# Patient Record
Sex: Female | Born: 1935 | Race: Asian | Hispanic: No | Marital: Single | Smoking: Never smoker
Health system: Southern US, Community
[De-identification: ages and names within clinical notes are randomized; demographics above are authoritative.]

## PROBLEM LIST (undated history)

## (undated) DIAGNOSIS — M549 Dorsalgia, unspecified: Secondary | ICD-10-CM

## (undated) DIAGNOSIS — I10 Essential (primary) hypertension: Secondary | ICD-10-CM

## (undated) DIAGNOSIS — M810 Age-related osteoporosis without current pathological fracture: Secondary | ICD-10-CM

## (undated) DIAGNOSIS — E559 Vitamin D deficiency, unspecified: Secondary | ICD-10-CM

## (undated) DIAGNOSIS — E7800 Pure hypercholesterolemia, unspecified: Secondary | ICD-10-CM

## (undated) HISTORY — PX: GALLBLADDER SURGERY: SHX652

---

## 2012-06-30 ENCOUNTER — Emergency Department (HOSPITAL_COMMUNITY)
Admission: EM | Admit: 2012-06-30 | Discharge: 2012-06-30 | Disposition: A | Discharge status: Home / Self Care | Payer: Medicaid Other | Source: Home / Self Care

## 2012-06-30 ENCOUNTER — Encounter (HOSPITAL_COMMUNITY): Payer: Self-pay | Admitting: Emergency Medicine

## 2012-06-30 DIAGNOSIS — J302 Other seasonal allergic rhinitis: Secondary | ICD-10-CM

## 2012-06-30 DIAGNOSIS — J309 Allergic rhinitis, unspecified: Secondary | ICD-10-CM

## 2012-06-30 MED ORDER — FEXOFENADINE HCL 180 MG PO TABS: 180.0000 mg | ORAL_TABLET | Freq: Every day | ORAL | Status: DC

## 2012-06-30 MED ORDER — FLUTICASONE PROPIONATE 50 MCG/ACT NA SUSP: 1.0000 | Freq: Two times a day (BID) | NASAL | Status: AC

## 2012-06-30 NOTE — ED Notes (Addendum)
Pt c/o sore throat with cough x 2 weeks. Mouth hurts and frequently has to spit. Feels numb all over with lack of appetite.  Also feels warm like she has a fever. Has been taking Robitussin and Acetaminophen with mild relief of symptoms.  Stomach feels warm, but no nausea, vomitting, or diarrhea. Patient is alert and oriented.

## 2012-06-30 NOTE — ED Provider Notes (Signed)
History     CSN: [id number]  Arrival date & time 06/30/12  1331   None     Chief Complaint  Patient presents with  . Sore Throat    (Consider location/radiation/quality/duration/timing/severity/associated sxs/prior treatment) Patient is a 79 y.o. female presenting with pharyngitis. The history is provided by the patient and a relative. The history is limited by a language barrier. Language interpreter used: family member transl.  Sore Throat This is a new problem. The current episode started more than 1 week ago. The problem has not changed since onset.The symptoms are aggravated by swallowing.    History reviewed. No pertinent past medical history.  Past Surgical History  Procedure Laterality Date  . Gallbladder surgery      No family history on file.  History  Substance Use Topics  . Smoking status: Never Smoker   . Smokeless tobacco: Not on file  . Alcohol Use: No    OB History   Grav Para Term Preterm Abortions TAB SAB Ect Mult Living                  Review of Systems  Constitutional: Negative.   HENT: Positive for congestion, sore throat, rhinorrhea, sneezing and postnasal drip.     Allergies  Ampicillin  Home Medications   Current Outpatient Rx  Name  Route  Sig  Dispense  Refill  . fexofenadine (ALLEGRA) 180 MG tablet   Oral   Take 1 tablet (180 mg total) by mouth daily.   30 tablet   1   . fluticasone (FLONASE) 50 MCG/ACT nasal spray   Nasal   Place 1 spray into the nose 2 (two) times daily.   1 g   2     BP 164/78  Pulse 64  Temp(Src) 98.1 F (36.7 C) (Oral)  Resp 16  SpO2 98%  Physical Exam  Nursing note and vitals reviewed. Constitutional: She is oriented to person, place, and time. She appears well-developed and well-nourished.  HENT:  Head: Normocephalic.  Right Ear: External ear normal.  Left Ear: External ear normal.  Mouth/Throat: Oropharynx is clear and moist.  Eyes: Pupils are equal, round, and reactive to light.   Neck: Normal range of motion. Neck supple.  Abdominal: Soft. Bowel sounds are normal. She exhibits no mass. There is no tenderness.  Lymphadenopathy:    She has no cervical adenopathy.  Neurological: She is alert and oriented to person, place, and time.  Skin: Skin is warm and dry.    ED Course  Procedures (including critical care time)  Labs Reviewed  POCT RAPID STREP A ([facility] CARE ONLY)   No results found.   1. Seasonal allergic rhinitis       MDM          Linna Hoff, MD 06/30/12 1520

## 2012-12-29 DIAGNOSIS — Z1211 Encounter for screening for malignant neoplasm of colon: Secondary | ICD-10-CM | POA: Diagnosis not present

## 2012-12-29 DIAGNOSIS — N951 Menopausal and female climacteric states: Secondary | ICD-10-CM | POA: Diagnosis not present

## 2012-12-29 DIAGNOSIS — N949 Unspecified condition associated with female genital organs and menstrual cycle: Secondary | ICD-10-CM | POA: Diagnosis not present

## 2012-12-29 DIAGNOSIS — Z23 Encounter for immunization: Secondary | ICD-10-CM | POA: Diagnosis not present

## 2012-12-29 DIAGNOSIS — E785 Hyperlipidemia, unspecified: Secondary | ICD-10-CM | POA: Diagnosis not present

## 2012-12-29 DIAGNOSIS — Z0000 Encounter for general adult medical examination without abnormal findings: Secondary | ICD-10-CM | POA: Diagnosis not present

## 2013-01-04 ENCOUNTER — Other Ambulatory Visit: Payer: Self-pay | Admitting: Family Medicine

## 2013-01-04 DIAGNOSIS — R102 Pelvic and perineal pain: Secondary | ICD-10-CM

## 2013-01-11 ENCOUNTER — Ambulatory Visit
Admission: RE | Admit: 2013-01-11 | Discharge: 2013-01-11 | Disposition: A | Discharge status: Home / Self Care | Payer: Medicare Other | Source: Ambulatory Visit | Attending: Family Medicine | Admitting: Family Medicine

## 2013-01-11 DIAGNOSIS — N949 Unspecified condition associated with female genital organs and menstrual cycle: Secondary | ICD-10-CM | POA: Diagnosis not present

## 2013-01-11 DIAGNOSIS — R102 Pelvic and perineal pain: Secondary | ICD-10-CM

## 2013-01-19 DIAGNOSIS — M810 Age-related osteoporosis without current pathological fracture: Secondary | ICD-10-CM | POA: Diagnosis not present

## 2013-01-19 DIAGNOSIS — Z780 Asymptomatic menopausal state: Secondary | ICD-10-CM | POA: Diagnosis not present

## 2013-06-01 ENCOUNTER — Encounter (HOSPITAL_COMMUNITY): Payer: Self-pay | Admitting: Radiology

## 2013-06-01 ENCOUNTER — Emergency Department (HOSPITAL_COMMUNITY): Payer: Medicare Other

## 2013-06-01 ENCOUNTER — Emergency Department (HOSPITAL_COMMUNITY)
Admission: EM | Admit: 2013-06-01 | Discharge: 2013-06-01 | Disposition: A | Discharge status: Home / Self Care | Payer: Medicare Other | Attending: Emergency Medicine | Admitting: Emergency Medicine

## 2013-06-01 DIAGNOSIS — R51 Headache: Secondary | ICD-10-CM | POA: Diagnosis not present

## 2013-06-01 DIAGNOSIS — Z881 Allergy status to other antibiotic agents status: Secondary | ICD-10-CM | POA: Diagnosis not present

## 2013-06-01 DIAGNOSIS — R519 Headache, unspecified: Secondary | ICD-10-CM

## 2013-06-01 DIAGNOSIS — R109 Unspecified abdominal pain: Secondary | ICD-10-CM | POA: Insufficient documentation

## 2013-06-01 LAB — COMPREHENSIVE METABOLIC PANEL
ALT: 21 U/L (ref 0–35)
AST: 41 U/L — ABNORMAL HIGH (ref 0–37)
Albumin: 4 g/dL (ref 3.5–5.2)
Alkaline Phosphatase: 89 U/L (ref 39–117)
BUN: 11 mg/dL (ref 6–23)
CO2: 25 mEq/L (ref 19–32)
Calcium: 9.5 mg/dL (ref 8.4–10.5)
Chloride: 106 mEq/L (ref 96–112)
Creatinine, Ser: 0.64 mg/dL (ref 0.50–1.10)
GFR calc Af Amer: >90 mL/min (ref >90)
GFR calc non Af Amer: 83 mL/min — ABNORMAL LOW (ref >90)
Glucose, Bld: 87 mg/dL (ref 70–99)
Potassium: 4 mEq/L (ref 3.7–5.3)
Sodium: 143 mEq/L (ref 137–147)
Total Bilirubin: 0.6 mg/dL (ref 0.3–1.2)
Total Protein: 7.8 g/dL (ref 6.0–8.3)

## 2013-06-01 LAB — CBC WITH DIFFERENTIAL/PLATELET
Basophils Absolute: 0 10*3/uL (ref 0.0–0.1)
Basophils Relative: 1 % (ref 0–1)
Eosinophils Absolute: 0.1 10*3/uL (ref 0.0–0.7)
Eosinophils Relative: 2 % (ref 0–5)
HCT: 38.3 % (ref 36.0–46.0)
Hemoglobin: 12.7 g/dL (ref 12.0–15.0)
Lymphocytes Relative: 30 % (ref 12–46)
Lymphs Abs: 1.8 10*3/uL (ref 0.7–4.0)
MCH: 29.6 pg (ref 26.0–34.0)
MCHC: 33.2 g/dL (ref 30.0–36.0)
MCV: 89.3 fL (ref 78.0–100.0)
Monocytes Absolute: 0.4 10*3/uL (ref 0.1–1.0)
Monocytes Relative: 7 % (ref 3–12)
Neutro Abs: 3.7 10*3/uL (ref 1.7–7.7)
Neutrophils Relative %: 61 % (ref 43–77)
Platelets: 128 10*3/uL — ABNORMAL LOW (ref 150–400)
RBC: 4.29 MIL/uL (ref 3.87–5.11)
RDW: 13.7 % (ref 11.5–15.5)
WBC: 6 10*3/uL (ref 4.0–10.5)

## 2013-06-01 LAB — URINALYSIS, ROUTINE W REFLEX MICROSCOPIC
Bilirubin Urine: NEGATIVE
Glucose, UA: NEGATIVE mg/dL
Hgb urine dipstick: NEGATIVE
Ketones, ur: NEGATIVE mg/dL
Leukocytes, UA: NEGATIVE
Nitrite: NEGATIVE
Protein, ur: NEGATIVE mg/dL
Specific Gravity, Urine: 1.01 (ref 1.005–1.030)
Urobilinogen, UA: 0.2 mg/dL (ref 0.0–1.0)
pH: 6.5 (ref 5.0–8.0)

## 2013-06-01 LAB — LIPASE, BLOOD: Lipase: 31 U/L (ref 11–59)

## 2013-06-01 NOTE — ED Notes (Addendum)
Pt reports dizziness, headache, LLQ pain in abdomen. Pain is 10/10. Pt reports bright red blood with BMs for 4 times. Belly is nontender. Denies black, tarry stools. Reports hx of stones in gallbladder when living in [place]; did US and stones were removed. Denies urinary symtpoms today. Vomited x 1. Denies bloody emesis. Denies LOC when dizzy. Pt also reports blurry vision with her symptoms. Reports her symptoms are similar to when she had gallbladder stones.

## 2013-06-01 NOTE — ED Notes (Signed)
Pt from home via [facility] with c/o headache x 1 month with neck pain and body aches.  Pt reports taking fluticasone for her headache and [place] Re[place] Mush[place] supplements.  Pt in NAD.

## 2013-06-01 NOTE — ED Notes (Signed)
interpreter phone being used to talk with pt.

## 2013-06-01 NOTE — ED Provider Notes (Signed)
CSN: [id number]     Arrival date & time 06/01/13  1328 History   First MD Initiated Contact with Patient 06/01/13 1403     Chief Complaint  Patient presents with  . Headache   Tanslator used, hx by patient and family HPI Pt has been having trouble with headache and dizziness for the last month.  It is a daily occurrence.  The headache is in the entire head.  She has not taken any medication for the headache.  She feels lightheaded.  The symptoms are mild.  She does not feel like she has a "heavy dizziness" or a strong sensation of movement.  No trouble with speech, vision, weakness, or falling to one side or the other.  She feels weak all over with the headache.  Pt is worried about stroke.  Pt also has had some mild pain in her lower abdomen on the left intermittently.  When she pushes on her abdomen it hurts.   Blood in stool previously but not recently.  Her primary concern is her head.  No fever or vomiting.  No weight loss.  Pt has had abdominal pain before.  Her PCP recommended colonoscopy last year but the patient was not interested. History reviewed. No pertinent past medical history. Past Surgical History  Procedure Laterality Date  . Gallbladder surgery     No family history on file. History  Substance Use Topics  . Smoking status: Never Smoker   . Smokeless tobacco: Not on file  . Alcohol Use: No   OB History   Grav Para Term Preterm Abortions TAB SAB Ect Mult Living                 Review of Systems  All other systems reviewed and are negative.     Allergies  Ampicillin  Home Medications   Prior to Admission medications   Medication Sig Start Date End Date Taking? Authorizing Provider  fluticasone (FLONASE) 50 MCG/ACT nasal spray Place 1 spray into the nose 2 (two) times daily. 06/30/12  Yes Linna HoffJames D Kindl, MD  OVER THE COUNTER MEDICATION Take 1 tablet by mouth daily. Japanese supplement   Yes Historical Provider, MD   BP 168/84  Pulse 61  Temp(Src) 98.5 F  (36.9 C) (Oral)  Resp 16  SpO2 100% Physical Exam  Nursing note and vitals reviewed. Constitutional: She is oriented to person, place, and time. She appears well-developed and well-nourished. No distress.  HENT:  Head: Normocephalic and atraumatic.  Right Ear: External ear normal.  Left Ear: External ear normal.  Mouth/Throat: Oropharynx is clear and moist.  Eyes: Conjunctivae are normal. Right eye exhibits no discharge. Left eye exhibits no discharge. No scleral icterus.  Neck: Neck supple. No tracheal deviation present.  Cardiovascular: Normal rate, regular rhythm and intact distal pulses.   Pulmonary/Chest: Effort normal and breath sounds normal. No stridor. No respiratory distress. She has no wheezes. She has no rales.  Abdominal: Soft. Bowel sounds are normal. She exhibits no distension. There is no tenderness. There is no rebound and no guarding.  Musculoskeletal: She exhibits no edema and no tenderness.  Neurological: She is alert and oriented to person, place, and time. She has normal strength. No cranial nerve deficit (no facial droop, extraocular movements intact, no slurred speech) or sensory deficit. She exhibits normal muscle tone. She displays no seizure activity. Coordination normal.  No pronator drift bilateral upper extrem, able to hold both legs off bed for 5 seconds, sensation intact in all extremities,  no visual field cuts, no left or right sided neglect, normal finger-nose exam bilaterally, no nystagmus noted   Skin: Skin is warm and dry. No rash noted.  Psychiatric: She has a normal mood and affect.    ED Course  Procedures (including critical care time) Labs Review Labs Reviewed  CBC WITH DIFFERENTIAL - Abnormal; Notable for the following:    Platelets 128 (*)    All other components within normal limits  COMPREHENSIVE METABOLIC PANEL - Abnormal; Notable for the following:    AST 41 (*)    GFR calc non Af Amer 83 (*)    All other components within normal  limits  URINALYSIS, ROUTINE W REFLEX MICROSCOPIC  LIPASE, BLOOD    Imaging Review Ct Head Wo Contrast  06/01/2013   CLINICAL DATA:  Headache  EXAM: CT HEAD WITHOUT CONTRAST  TECHNIQUE: Contiguous axial images were obtained from the base of the skull through the vertex without intravenous contrast.  COMPARISON:  None.  FINDINGS: Mild atrophy, appropriate for age. Mild chronic microvascular ischemic change in the left frontal white matter.  Negative for acute infarct. Negative for hemorrhage or mass. No acute skull abnormality.  IMPRESSION: No acute abnormality. Small area of chronic ischemia left frontal white matter.   Electronically Signed   By: Marlan Palauharles  Clark M.D.   On: 06/01/2013 16:43   Dg Abd Acute W/chest  06/01/2013   CLINICAL DATA:  Abdominal pain  EXAM: ACUTE ABDOMEN SERIES (ABDOMEN 2 VIEW & CHEST 1 VIEW)  COMPARISON:  None.  FINDINGS: The heart and pulmonary vascularity are within normal limits. The lungs are well aerated. Exuberant calcification is noted at the first costochondral margin on the left. No focal infiltrate is seen.  The abdomen shows a nonobstructive bowel gas pattern. Fecal material is seen throughout the colon. Degenerative changes of the lumbar spine are seen. No acute bony abnormality is noted.  IMPRESSION: Nonspecific chest and abdomen.   Electronically Signed   By: Alcide CleverMark  Lukens M.D.   On: 06/01/2013 16:17     MDM   Final diagnoses:  Headache    Normal neuro exam.  Symptoms have been ongoing for a few months.  Pt is in no distress in the ED, smiling, able to stand without difficulty.  ?tension headache.  Doubt acute emergency medical condition, ie hemorrhage, stroke.  Abdominal pain is also an intermittent chronic complaint.  No abdominal ttp on exam.  Nl CBC.    Since she has seen blood intermittently colonoscopy would be a reasonable follow up tests.  Discussed follow up with PCP whom suggested this in the past.  Discussed findings and recommendation with  patient and family who are reassured and comfortable with outpatient follow up.    Celene KrasJon R Dorr Perrot, MD 06/01/13 [phone]

## 2013-06-01 NOTE — ED Notes (Signed)
Pt back from CT

## 2013-06-01 NOTE — Discharge Instructions (Signed)
?au ??u Do C?ng Th?ng (Tension Headache) ?au ??u do c?ng th?ng l m?t c?m gic ?au, p l?c ho?c ?au th??ng c?m th?y pha tr??c v hai bn ??u. C?n ?au c th? khng r rng ho?c c th? c?m th?y ch?t (th?t). ?y l ki?u ?au ??u ph? bi?n nh?t. ?au ??u c?ng th?ng th??ng khng km theo bu?n nn ho?c nn m?a v khng tr? nn t?i t? h?n v?i ho?t ??ng th? ch?t. ?au ??u do c?ng th?ng c th? ko di 30 pht ??n vi ngy.  NGUYN NHN Nguyn nhn chnh xc khng ???c xc ??nh, nh?ng c th? gy ra b?i ha ch?t v hocmon trong no d?n ??n ?au. ?au ??u do c?ng th?ng th??ng b?t ??u sau khi c?ng th?ng, lo u ho?c tr?m c?m. Cc tc nhn khc c th? bao g?m:  R??u.  Caffeine (qu nhi?u ho?c thu h?i).  Nhi?m trng h h?p (c?m l?nh, cm, vim xoang).  V?n ?? nha khoa ho?c si?t ch?t r?ng.  M?t m?i.  Gi? ??u v c? c?a b?n ? m?t v? tr qu lu trong khi s? d?ng my tnh. TRI?U CH?NG  C?ng th?ng xung quanh ??u.  ?au ??u khng r rng.  C?m th?y ?au pha tr??c v hai bn ??u.  Nh?y c?m ?au ? cc c? c?a ??u, c? v vai. CH?N ?ON ?au ??u do c?ng th?ng th??ng ???c ch?n ?on d?a vo:  Cc tri?u ch?ng.  Khm th?c th?.  Ch?p CT ho?c MRI ??u c?a b?n. C th? yu c?u cc xt nghi?m n?u c cc tri?u ch?ng n?ng ho?c b?t th??ng. ?I?U TR? Thu?c c th? ???c ch? ??nh ?? gip gi?m cc tri?u ch?ng. H??NG D?N CH?M [place] T?I NH  Ch? s? d?ng thu?ckhng c?n k toa ho?c thu?c c?n k toa ?? gi?m ?au ho?c gi?m c?m gic kh ch?u theo ch? d?n c?a chuyn gia ch?m [place] s?c kh?e c?a b?n.  N?m xu?ng trong m?t c?n phng t?i, yn t?nh, khi b?n b? ?au ??u.  Ghi chp l?i ?? tm hi?u nh?ng g c th? gy ra ch?ng ?au ??u c?a b?n. V d?, ghi l?i:  Nh?ng g b?n ?n v u?ng.  B?n ng? ???c bao lu.  B?t k? thay ??i no c?a ch? ?? ?n u?ng ho?c cc lo?i thu?c c?a b?n.  Hy th? xoa bp ho?c cc k? thu?t th? gin khc.  C th? ch??m n??c ? ho?c nng ln ??u v c?. S? d?ng cc cch ny t? 3 ??n 4 l?n m?i ngy trong 15 ??n 20 pht m?i  l?n, ho?c khi c?n thi?t.  H?n ch? c?ng th?ng.  Ng?i th?ng v khng lm c?ng c? c?a b?n.  B? thu?c l, n?u b?n ht thu?c.  H?n ch? s? d?ng r??u.  Gi?m l??ng caffeine b?n u?ng ho?c ng?ng u?ng caffeine.  ?n v t?p th? d?c th??ng xuyn.  Ng? t? 7 ??n 9 ti?ng, ho?c theo l?i khuyn c?a chuyn gia ch?m [place] s?c kh?e.  [place] s? d?ng thu?c gi?m ?au qu m??c, vi? co? th? xa?y ra ?au ??u ti pht. HY ?I KHM N?U:  B?n c cc v?n ?? v?i cc lo?i thu?c ???c ch? ??nh.  Thu?c c?a b?n khng c tc d?ng.  ?au ??u thng th??ng thay ??i khng bnh th??ng.  B?n b? bu?n nn ho?c nn m?a. HY NGAY L?P T?C ?I KHM N?U:  C?n ?au ??u c?a b?n tr? nn tr?m tr?ng.  B?n b? s?t.  B?n b? c?ng c?.  B?n b? m?t th? l?c.  B?n b? y?u c? ho?c m?t ki?m sot c? b?p.  B?n b? m?t th?ng b?ng ho?c c v?n ?? v?i vi?c ?i b?.  B?n c?m th?y mu?n ng?t ho?c ng?t.  B?n c nh?ng tri?u ch?ng nghim tr?ng khc v?i cc tri?u ch?ng ??u tin. ??M B?O B?N:  Hi?u cc h??ng d?n ny.  S? theo di tnh tr?ng c?a mnh.  S? yu c?u tr? gip ngay l?p t?c n?u b?n c?m th?y khng ?? ho?c tnh tr?ng tr?m tr?ng h?n. Document Released: 01/28/2005 Document Revised: 09/30/2012 [facility] Patient Information 2014 [employer] [place] [place].

## 2013-06-01 NOTE — ED Notes (Signed)
phlebotomy at bedside.  

## 2013-06-28 DIAGNOSIS — M545 Low back pain, unspecified: Secondary | ICD-10-CM | POA: Diagnosis not present

## 2013-06-28 DIAGNOSIS — M810 Age-related osteoporosis without current pathological fracture: Secondary | ICD-10-CM | POA: Diagnosis not present

## 2013-06-28 DIAGNOSIS — M542 Cervicalgia: Secondary | ICD-10-CM | POA: Diagnosis not present

## 2015-11-21 DIAGNOSIS — Z23 Encounter for immunization: Secondary | ICD-10-CM | POA: Diagnosis not present

## 2016-01-23 ENCOUNTER — Emergency Department (HOSPITAL_COMMUNITY): Payer: Medicare Other

## 2016-01-23 ENCOUNTER — Emergency Department (HOSPITAL_COMMUNITY)
Admission: EM | Admit: 2016-01-23 | Discharge: 2016-01-23 | Disposition: A | Discharge status: Home / Self Care | Payer: Medicare Other | Attending: Emergency Medicine | Admitting: Emergency Medicine

## 2016-01-23 ENCOUNTER — Encounter (HOSPITAL_COMMUNITY): Payer: Self-pay | Admitting: Nurse Practitioner

## 2016-01-23 DIAGNOSIS — R079 Chest pain, unspecified: Secondary | ICD-10-CM | POA: Diagnosis not present

## 2016-01-23 DIAGNOSIS — R109 Unspecified abdominal pain: Secondary | ICD-10-CM | POA: Insufficient documentation

## 2016-01-23 DIAGNOSIS — I10 Essential (primary) hypertension: Secondary | ICD-10-CM

## 2016-01-23 DIAGNOSIS — R51 Headache: Secondary | ICD-10-CM | POA: Insufficient documentation

## 2016-01-23 DIAGNOSIS — R0602 Shortness of breath: Secondary | ICD-10-CM | POA: Diagnosis not present

## 2016-01-23 HISTORY — DX: Dorsalgia, unspecified: M54.9

## 2016-01-23 HISTORY — DX: Age-related osteoporosis without current pathological fracture: M81.0

## 2016-01-23 HISTORY — DX: Pure hypercholesterolemia, unspecified: E78.00

## 2016-01-23 HISTORY — DX: Vitamin D deficiency, unspecified: E55.9

## 2016-01-23 HISTORY — DX: Essential (primary) hypertension: I10

## 2016-01-23 LAB — URINALYSIS, ROUTINE W REFLEX MICROSCOPIC
Bilirubin Urine: NEGATIVE
Glucose, UA: NEGATIVE mg/dL
Hgb urine dipstick: NEGATIVE
Ketones, ur: NEGATIVE mg/dL
LEUKOCYTES UA: NEGATIVE
NITRITE: NEGATIVE
Protein, ur: NEGATIVE mg/dL
SPECIFIC GRAVITY, URINE: 1.008 (ref 1.005–1.030)
pH: 7 (ref 5.0–8.0)

## 2016-01-23 LAB — BASIC METABOLIC PANEL
ANION GAP: 4 — AB (ref 5–15)
BUN: 9 mg/dL (ref 6–20)
CO2: 30 mmol/L (ref 22–32)
Calcium: 9.4 mg/dL (ref 8.9–10.3)
Chloride: 105 mmol/L (ref 101–111)
Creatinine, Ser: 0.65 mg/dL (ref 0.44–1.00)
GFR calc non Af Amer: >60 mL/min (ref >60)
GLUCOSE: 118 mg/dL — AB (ref 65–99)
POTASSIUM: 3.5 mmol/L (ref 3.5–5.1)
Sodium: 139 mmol/L (ref 135–145)

## 2016-01-23 LAB — CBC
HEMATOCRIT: 38 % (ref 36.0–46.0)
HEMOGLOBIN: 12.5 g/dL (ref 12.0–15.0)
MCH: 29.2 pg (ref 26.0–34.0)
MCHC: 32.9 g/dL (ref 30.0–36.0)
MCV: 88.8 fL (ref 78.0–100.0)
Platelets: 130 10*3/uL — ABNORMAL LOW (ref 150–400)
RBC: 4.28 MIL/uL (ref 3.87–5.11)
RDW: 13.7 % (ref 11.5–15.5)
WBC: 5.8 10*3/uL (ref 4.0–10.5)

## 2016-01-23 LAB — I-STAT TROPONIN, ED
TROPONIN I, POC: 0 ng/mL (ref 0.00–0.08)
Troponin i, poc: 0.01 ng/mL (ref 0.00–0.08)

## 2016-01-23 MED ORDER — HYDROCHLOROTHIAZIDE 12.5 MG PO CAPS
12.5000 mg | ORAL_CAPSULE | Freq: Every day | ORAL | Status: DC
  Administered 2016-01-23: 12.5 mg via ORAL
  Filled 2016-01-23: qty 1

## 2016-01-23 MED ORDER — HYDROCHLOROTHIAZIDE 25 MG PO TABS: 25.0000 mg | ORAL_TABLET | Freq: Every day | ORAL | 0 refills | Status: AC

## 2016-01-23 MED ORDER — ACETAMINOPHEN 500 MG PO TABS
1000.0000 mg | ORAL_TABLET | Freq: Once | ORAL | Status: AC
  Administered 2016-01-23: 1000 mg via ORAL
  Filled 2016-01-23: qty 2

## 2016-01-23 NOTE — ED Notes (Signed)
Patient transported to CT 

## 2016-01-23 NOTE — ED Triage Notes (Signed)
Pt presents from Dr. Rema FendtWhite, [facility], with c /o HTN. She has a history of HTN but has not been on any medications for this. She went to her family doctor because she has had fatigue, headaches, CP, SOB over this past weekend. She denies fevers, cough

## 2016-01-23 NOTE — ED Notes (Addendum)
Translator used  [phone]

## 2016-01-23 NOTE — ED Provider Notes (Signed)
[facility] Provider Note   CSN: [id number] Arrival date & time: 01/23/16  1142     History   Chief Complaint Chief Complaint  Patient presents with  . Hypertension    HPI Katie [name] is a 82 y.o. female.  82 yo F with a chief complaint of chest pain shortness breath and weakness. This been going on for the past 2 or 3 days. Was preceded by left flank pain. This is worse with movement and palpation twisting. Denies injury. Denies falls. Patient went to see their family physician today were noted to be hypertensive. There is then sent here to rule out MI. Patient has no prior history of heart attack. Has no history of hypertension hyperlipidemia or diabetes. There are no family members with MI in the past. She does not smoke. No prior history of stroke. They deny any lateral weakness or difficulty with speech.   The history is provided by the patient and a relative. The history is limited by a language barrier. A language interpreter was used.  Hypertension  Associated symptoms include chest pain, headaches and shortness of breath.  Illness  This is a new problem. The current episode started more than 2 days ago. The problem occurs constantly. The problem has not changed since onset.Associated symptoms include chest pain, headaches and shortness of breath. Nothing (spontaneously) aggravates the symptoms. Nothing relieves the symptoms. She has tried nothing for the symptoms. The treatment provided no relief.    Past Medical History:  Diagnosis Date  . Back pain   . Hypercholesteremia   . Hypertension   . Osteoporosis   . Vitamin D deficiency     There are no active problems to display for this patient.   Past Surgical History:  Procedure Laterality Date  . GALLBLADDER SURGERY      OB History    No data available       Home Medications    Prior to Admission medications   Medication Sig Start Date End Date Taking? Authorizing Provider  fluticasone  (FLONASE) 50 MCG/ACT nasal spray Place 1 spray into the nose 2 (two) times daily. 06/30/12   Linna HoffJames D Kindl, MD  hydrochlorothiazide (HYDRODIURIL) 25 MG tablet Take 1 tablet (25 mg total) by mouth daily. 01/23/16   Melene Planan Dymond Spreen, DO  OVER THE COUNTER MEDICATION Take 1 tablet by mouth daily. Japanese supplement    Historical Provider, MD    Family History History reviewed. No pertinent family history.  Social History Social History  Substance Use Topics  . Smoking status: Never Smoker  . Smokeless tobacco: Never Used  . Alcohol use No     Allergies   Ampicillin   Review of Systems Review of Systems  Constitutional: Negative for chills and fever.  HENT: Negative for congestion and rhinorrhea.   Eyes: Negative for redness and visual disturbance.  Respiratory: Positive for shortness of breath. Negative for wheezing.   Cardiovascular: Positive for chest pain. Negative for palpitations.  Gastrointestinal: Negative for nausea and vomiting.  Genitourinary: Positive for flank pain (left sided). Negative for dysuria and urgency.  Musculoskeletal: Negative for arthralgias and myalgias.  Skin: Negative for pallor and wound.  Neurological: Positive for headaches. Negative for dizziness.     Physical Exam Updated Vital Signs BP 184/85   Pulse 76   Temp 98.4 F (36.9 C) (Oral)   Resp 19   SpO2 99%   Physical Exam  Constitutional: She is oriented to person, place, and time. She appears well-developed  and well-nourished. No distress.  HENT:  Head: Normocephalic and atraumatic.  Eyes: EOM are normal. Pupils are equal, round, and reactive to light.  Neck: Normal range of motion. Neck supple.  Cardiovascular: Normal rate and regular rhythm.  Exam reveals no gallop and no friction rub.   No murmur heard. Pulmonary/Chest: Effort normal. She has no wheezes. She has no rales.  Abdominal: Soft. She exhibits no distension and no mass. There is no tenderness. There is no guarding.    Musculoskeletal: She exhibits no edema or tenderness.  Neurological: She is alert and oriented to person, place, and time. She has normal strength. No cranial nerve deficit or sensory deficit. Coordination and gait normal. GCS eye subscore is 4. GCS verbal subscore is 5. GCS motor subscore is 6. She displays no Babinski's sign on the right side. She displays no Babinski's sign on the left side.  Reflex Scores:      Tricep reflexes are 2+ on the right side and 2+ on the left side.      Bicep reflexes are 2+ on the right side and 2+ on the left side.      Brachioradialis reflexes are 2+ on the right side and 2+ on the left side.      Patellar reflexes are 2+ on the right side and 2+ on the left side.      Achilles reflexes are 2+ on the right side and 2+ on the left side. Skin: Skin is warm and dry. She is not diaphoretic.  Psychiatric: She has a normal mood and affect. Her behavior is normal.  Nursing note and vitals reviewed.    ED Treatments / Results  Labs (all labs ordered are listed, but only abnormal results are displayed) Labs Reviewed  BASIC METABOLIC PANEL - Abnormal; Notable for the following:       Result Value   Glucose, Bld 118 (*)    Anion gap 4 (*)    All other components within normal limits  CBC - Abnormal; Notable for the following:    Platelets 130 (*)    All other components within normal limits  URINALYSIS, ROUTINE W REFLEX MICROSCOPIC - Abnormal; Notable for the following:    Color, Urine STRAW (*)    All other components within normal limits  URINE CULTURE  I-STAT TROPOININ, ED  I-STAT TROPOININ, ED    EKG  EKG Interpretation  Date/Time:  Tuesday January 23 2016 11:45:16 EST Ventricular Rate:  68 PR Interval:  140 QRS Duration: 72 QT Interval:  410 QTC Calculation: 435 R Axis:   67 Text Interpretation:  Normal sinus rhythm Nonspecific ST and T wave abnormality Abnormal ECG No old tracing to compare Confirmed by Matai Carpenito MD, DANIEL [phone]([id number]) on  01/23/2016 3:12:10 PM       Radiology Dg Chest 2 View  Result Date: 01/23/2016 CLINICAL DATA:  Chest pain and shortness of breath for several days EXAM: CHEST  2 VIEW COMPARISON:  06/01/2013 FINDINGS: Cardiac shadow is stable. The lungs are well aerated bilaterally. Exuberant calcification is again noted at the left first costochondral margin. Calcified granuloma in the right lung apex is noted. No focal infiltrate or sizable effusion is seen. No acute bony abnormality is noted. IMPRESSION: No acute abnormality seen. Electronically Signed   By: Alcide CleverMark  Lukens M.D.   On: 01/23/2016 12:25   Ct Head Wo Contrast  Result Date: 01/23/2016 CLINICAL DATA:  Headache, fatigue EXAM: CT HEAD WITHOUT CONTRAST TECHNIQUE: Contiguous axial images were obtained from the base  of the skull through the vertex without intravenous contrast. COMPARISON:  06/01/2013 FINDINGS: Brain: No acute intracranial abnormality. Specifically, no hemorrhage, hydrocephalus, mass lesion, acute infarction, or significant intracranial injury. Vascular: No hyperdense vessel or unexpected calcification. Skull: No acute calvarial abnormality Sinuses/Orbits: Visualized paranasal sinuses and mastoids clear. Orbital soft tissues unremarkable. Other: None IMPRESSION: No acute intracranial abnormality. Electronically Signed   By: Charlett Nose M.D.   On: 01/23/2016 16:14    Procedures Procedures (including critical care time)  Medications Ordered in ED Medications  hydrochlorothiazide (MICROZIDE) capsule 12.5 mg (12.5 mg Oral Given 01/23/16 1700)  acetaminophen (TYLENOL) tablet 1,000 mg (1,000 mg Oral Given 01/23/16 1700)     Initial Impression / Assessment and Plan / ED Course  I have reviewed the triage vital signs and the nursing notes.  Pertinent labs & imaging results that were available during my care of the patient were reviewed by me and considered in my medical decision making (see chart for details).  Clinical Course      82 yo F With a chief complaints of chest pain shortness breath headache and flank pain. Going on for the past 2 or 3 days. Back pain seems muscular in origin as is worse with palpation and twisting and walking. Will obtain a UA to evaluate for possible pyelo could lead to her general weakness and headache. Patient's chest pain is episodic seems atypical in nature. Initial troponin was negative. EKG with no concerning findings. Will repeat a troponin. Patient with headaches and does not have them at baseline will CT due to age and uncommon headache.  CT negative, delta trop negative. PCP follow up.  Started on hctz.  6:15 PM:  I have discussed the diagnosis/risks/treatment options with the patient and family and believe the pt to be eligible for discharge home to follow-up with PCP. We also discussed returning to the ED immediately if new or worsening sx occur. We discussed the sx which are most concerning (e.g., sudden worsening pain, fever, inability to tolerate by mouth) that necessitate immediate return. Medications administered to the patient during their visit and any new prescriptions provided to the patient are listed below.  Medications given during this visit Medications  hydrochlorothiazide (MICROZIDE) capsule 12.5 mg (12.5 mg Oral Given 01/23/16 1700)  acetaminophen (TYLENOL) tablet 1,000 mg (1,000 mg Oral Given 01/23/16 1700)     The patient appears reasonably screen and/or stabilized for discharge and I doubt any other medical condition or other [place] requiring further screening, evaluation, or treatment in the ED at this time prior to discharge.    Final Clinical Impressions(s) / ED Diagnoses   Final diagnoses:  Hypertension, unspecified type    New Prescriptions New Prescriptions   HYDROCHLOROTHIAZIDE (HYDRODIURIL) 25 MG TABLET    Take 1 tablet (25 mg total) by mouth daily.     Melene Plan, DO 01/23/16 1815

## 2016-01-23 NOTE — ED Notes (Signed)
Interpreter used [phone]

## 2016-01-24 LAB — URINE CULTURE: Culture: NO GROWTH

## 2016-02-02 ENCOUNTER — Ambulatory Visit
Admission: RE | Admit: 2016-02-02 | Discharge: 2016-02-02 | Disposition: A | Discharge status: Home / Self Care | Payer: Medicare Other | Source: Ambulatory Visit | Attending: Family Medicine | Admitting: Family Medicine

## 2016-02-02 ENCOUNTER — Other Ambulatory Visit: Payer: Self-pay | Admitting: Family Medicine

## 2016-02-02 DIAGNOSIS — M545 Low back pain: Secondary | ICD-10-CM | POA: Diagnosis not present

## 2016-02-02 DIAGNOSIS — M5136 Other intervertebral disc degeneration, lumbar region: Secondary | ICD-10-CM | POA: Diagnosis not present

## 2016-02-02 DIAGNOSIS — I10 Essential (primary) hypertension: Secondary | ICD-10-CM | POA: Diagnosis not present

## 2016-02-02 DIAGNOSIS — G8929 Other chronic pain: Secondary | ICD-10-CM | POA: Diagnosis not present

## 2016-02-15 ENCOUNTER — Other Ambulatory Visit: Payer: Self-pay | Admitting: Family Medicine

## 2016-02-15 DIAGNOSIS — M545 Low back pain: Secondary | ICD-10-CM

## 2016-02-22 ENCOUNTER — Ambulatory Visit
Admission: RE | Admit: 2016-02-22 | Discharge: 2016-02-22 | Disposition: A | Discharge status: Home / Self Care | Payer: Medicare Other | Source: Ambulatory Visit | Attending: Family Medicine | Admitting: Family Medicine

## 2016-02-22 DIAGNOSIS — M48061 Spinal stenosis, lumbar region without neurogenic claudication: Secondary | ICD-10-CM | POA: Diagnosis not present

## 2016-02-22 DIAGNOSIS — M545 Low back pain: Secondary | ICD-10-CM

## 2016-05-02 DIAGNOSIS — I10 Essential (primary) hypertension: Secondary | ICD-10-CM | POA: Diagnosis not present

## 2016-05-02 DIAGNOSIS — Z789 Other specified health status: Secondary | ICD-10-CM | POA: Diagnosis not present

## 2016-05-02 DIAGNOSIS — Z1389 Encounter for screening for other disorder: Secondary | ICD-10-CM | POA: Diagnosis not present

## 2016-05-02 DIAGNOSIS — M545 Low back pain: Secondary | ICD-10-CM | POA: Diagnosis not present

## 2016-05-02 DIAGNOSIS — Z13220 Encounter for screening for lipoid disorders: Secondary | ICD-10-CM | POA: Diagnosis not present

## 2016-05-02 DIAGNOSIS — J301 Allergic rhinitis due to pollen: Secondary | ICD-10-CM | POA: Diagnosis not present

## 2016-06-06 DIAGNOSIS — M549 Dorsalgia, unspecified: Secondary | ICD-10-CM | POA: Diagnosis not present

## 2016-06-17 DIAGNOSIS — M48062 Spinal stenosis, lumbar region with neurogenic claudication: Secondary | ICD-10-CM | POA: Diagnosis not present

## 2016-07-09 DIAGNOSIS — M47896 Other spondylosis, lumbar region: Secondary | ICD-10-CM | POA: Diagnosis not present

## 2016-07-09 DIAGNOSIS — M48062 Spinal stenosis, lumbar region with neurogenic claudication: Secondary | ICD-10-CM | POA: Diagnosis not present

## 2016-08-06 DIAGNOSIS — Z1389 Encounter for screening for other disorder: Secondary | ICD-10-CM | POA: Diagnosis not present

## 2016-08-06 DIAGNOSIS — E559 Vitamin D deficiency, unspecified: Secondary | ICD-10-CM | POA: Diagnosis not present

## 2016-08-06 DIAGNOSIS — I10 Essential (primary) hypertension: Secondary | ICD-10-CM | POA: Diagnosis not present

## 2016-08-06 DIAGNOSIS — J301 Allergic rhinitis due to pollen: Secondary | ICD-10-CM | POA: Diagnosis not present

## 2016-08-06 DIAGNOSIS — Z0000 Encounter for general adult medical examination without abnormal findings: Secondary | ICD-10-CM | POA: Diagnosis not present

## 2016-08-06 DIAGNOSIS — M545 Low back pain: Secondary | ICD-10-CM | POA: Diagnosis not present

## 2016-08-06 DIAGNOSIS — Z681 Body mass index (BMI) 19 or less, adult: Secondary | ICD-10-CM | POA: Diagnosis not present

## 2016-08-06 DIAGNOSIS — M810 Age-related osteoporosis without current pathological fracture: Secondary | ICD-10-CM | POA: Diagnosis not present

## 2016-08-06 DIAGNOSIS — D696 Thrombocytopenia, unspecified: Secondary | ICD-10-CM | POA: Diagnosis not present

## 2016-12-10 DIAGNOSIS — Z23 Encounter for immunization: Secondary | ICD-10-CM | POA: Diagnosis not present

## 2016-12-24 ENCOUNTER — Emergency Department (HOSPITAL_COMMUNITY)
Admission: EM | Admit: 2016-12-24 | Discharge: 2016-12-24 | Disposition: A | Discharge status: Home / Self Care | Payer: Medicare Other | Attending: Emergency Medicine | Admitting: Emergency Medicine

## 2016-12-24 ENCOUNTER — Emergency Department (HOSPITAL_COMMUNITY): Payer: Medicare Other

## 2016-12-24 ENCOUNTER — Encounter (HOSPITAL_COMMUNITY): Payer: Self-pay | Admitting: Emergency Medicine

## 2016-12-24 ENCOUNTER — Other Ambulatory Visit: Payer: Self-pay

## 2016-12-24 DIAGNOSIS — Z79899 Other long term (current) drug therapy: Secondary | ICD-10-CM | POA: Diagnosis not present

## 2016-12-24 DIAGNOSIS — E860 Dehydration: Secondary | ICD-10-CM

## 2016-12-24 DIAGNOSIS — R110 Nausea: Secondary | ICD-10-CM | POA: Diagnosis not present

## 2016-12-24 DIAGNOSIS — R079 Chest pain, unspecified: Secondary | ICD-10-CM | POA: Diagnosis not present

## 2016-12-24 DIAGNOSIS — R51 Headache: Secondary | ICD-10-CM | POA: Insufficient documentation

## 2016-12-24 DIAGNOSIS — I10 Essential (primary) hypertension: Secondary | ICD-10-CM | POA: Insufficient documentation

## 2016-12-24 DIAGNOSIS — R0789 Other chest pain: Secondary | ICD-10-CM | POA: Insufficient documentation

## 2016-12-24 DIAGNOSIS — R42 Dizziness and giddiness: Secondary | ICD-10-CM | POA: Diagnosis not present

## 2016-12-24 DIAGNOSIS — R918 Other nonspecific abnormal finding of lung field: Secondary | ICD-10-CM | POA: Diagnosis not present

## 2016-12-24 LAB — BASIC METABOLIC PANEL
ANION GAP: 8 (ref 5–15)
BUN: 7 mg/dL (ref 6–20)
CHLORIDE: 103 mmol/L (ref 101–111)
CO2: 26 mmol/L (ref 22–32)
Calcium: 9.3 mg/dL (ref 8.9–10.3)
Creatinine, Ser: 0.81 mg/dL (ref 0.44–1.00)
GFR calc Af Amer: >60 mL/min (ref >60)
GLUCOSE: 105 mg/dL — AB (ref 65–99)
POTASSIUM: 3.8 mmol/L (ref 3.5–5.1)
Sodium: 137 mmol/L (ref 135–145)

## 2016-12-24 LAB — HEPATIC FUNCTION PANEL
ALK PHOS: 72 U/L (ref 38–126)
ALT: 31 U/L (ref 14–54)
AST: 81 U/L — AB (ref 15–41)
Albumin: 4.2 g/dL (ref 3.5–5.0)
Bilirubin, Direct: 0.2 mg/dL (ref 0.1–0.5)
Indirect Bilirubin: 1.1 mg/dL — ABNORMAL HIGH (ref 0.3–0.9)
TOTAL PROTEIN: 7.4 g/dL (ref 6.5–8.1)
Total Bilirubin: 1.3 mg/dL — ABNORMAL HIGH (ref 0.3–1.2)

## 2016-12-24 LAB — CBC
HEMATOCRIT: 37.8 % (ref 36.0–46.0)
HEMOGLOBIN: 12.4 g/dL (ref 12.0–15.0)
MCH: 29.5 pg (ref 26.0–34.0)
MCHC: 32.8 g/dL (ref 30.0–36.0)
MCV: 89.8 fL (ref 78.0–100.0)
Platelets: 130 10*3/uL — ABNORMAL LOW (ref 150–400)
RBC: 4.21 MIL/uL (ref 3.87–5.11)
RDW: 13.7 % (ref 11.5–15.5)
WBC: 6.6 10*3/uL (ref 4.0–10.5)

## 2016-12-24 LAB — I-STAT TROPONIN, ED
Troponin i, poc: 0 ng/mL (ref 0.00–0.08)
Troponin i, poc: 0 ng/mL (ref 0.00–0.08)

## 2016-12-24 LAB — URINALYSIS, ROUTINE W REFLEX MICROSCOPIC
Bilirubin Urine: NEGATIVE
Glucose, UA: NEGATIVE mg/dL
Hgb urine dipstick: NEGATIVE
Ketones, ur: 5 mg/dL — AB
LEUKOCYTES UA: NEGATIVE
Nitrite: NEGATIVE
PROTEIN: NEGATIVE mg/dL
SPECIFIC GRAVITY, URINE: 1.008 (ref 1.005–1.030)
pH: 8 (ref 5.0–8.0)

## 2016-12-24 MED ORDER — ONDANSETRON HCL 4 MG/2ML IJ SOLN
4.0000 mg | Freq: Once | INTRAMUSCULAR | Status: AC
  Administered 2016-12-24: 4 mg via INTRAVENOUS
  Filled 2016-12-24: qty 2

## 2016-12-24 MED ORDER — SODIUM CHLORIDE 0.9 % IV BOLUS (SEPSIS)
1000.0000 mL | Freq: Once | INTRAVENOUS | Status: AC
  Administered 2016-12-24: 1000 mL via INTRAVENOUS

## 2016-12-24 MED ORDER — ONDANSETRON 4 MG PO TBDP: 4.0000 mg | ORAL_TABLET | Freq: Three times a day (TID) | ORAL | 0 refills | Status: AC | PRN

## 2016-12-24 NOTE — ED Provider Notes (Signed)
[facility]   CSN: [id number] Arrival date & time: 12/24/16  [id number]     History   Chief Complaint Chief Complaint  Patient presents with  . Chest Pain    HPI Katie [name] is a 83 y.o. female.  Pt presents to the ED today with dizziness, nausea, and headache.  The pt speaks [place] and her son translates.  Pt said she has had sx for 2 days.  She said the dizziness is worse with standing.  The pt feels ok lying down.      Past Medical History:  Diagnosis Date  . Back pain   . Hypercholesteremia   . Hypertension   . Osteoporosis   . Vitamin D deficiency     There are no active problems to display for this patient.   Past Surgical History:  Procedure Laterality Date  . GALLBLADDER SURGERY      OB History    No data available       Home Medications    Prior to Admission medications   Medication Sig Start Date End Date Taking? Authorizing Provider  acetaminophen (TYLENOL) 500 MG tablet Take 500 mg every 6 (six) hours as needed by mouth for mild pain or headache.   Yes [provider]  alendronate (FOSAMAX) 70 MG tablet Take 70 mg once a week by mouth. Take with a full glass of water on an empty stomach.   Yes [provider]  hydrochlorothiazide (HYDRODIURIL) 25 MG tablet Take 1 tablet (25 mg total) by mouth daily. 01/23/16  Yes Melene PlanFloyd, Dan, DO  Menthol-Methyl Salicylate (MUSCLE RUB EX) Apply 1 application as needed topically (muscle aches).   Yes [provider]  Vitamin D, Ergocalciferol, (DRISDOL) 50000 units CAPS capsule Take 50,000 Units daily by mouth.   Yes [provider]  fluticasone (FLONASE) 50 MCG/ACT nasal spray Place 1 spray into the nose 2 (two) times daily. 06/30/12   Linna HoffKindl, James D, MD  ondansetron (ZOFRAN ODT) 4 MG disintegrating tablet Take 1 tablet (4 mg total) every 8 (eight) hours as needed by mouth. 12/24/16   Jacalyn LefevreHaviland, Hearl Heikes, MD    Family  History History reviewed. No pertinent family history.  Social History Social History   Tobacco Use  . Smoking status: Never Smoker  . Smokeless tobacco: Never Used  Substance Use Topics  . Alcohol use: No  . Drug use: No     Allergies   Ampicillin   Review of Systems Review of Systems  Neurological: Positive for dizziness and weakness.  All other systems reviewed and are negative.    Physical Exam Updated Vital Signs BP (!) 168/82   Pulse 71   Temp 98.4 F (36.9 C) (Oral)   Resp 16   SpO2 98%   Physical Exam  Constitutional: She appears well-developed and well-nourished.  HENT:  Head: Normocephalic and atraumatic.  Eyes: EOM are normal. Pupils are equal, round, and reactive to light.  Neck: Normal range of motion. Neck supple.  Cardiovascular: Normal rate, regular rhythm and normal pulses.  Pulmonary/Chest: Effort normal and breath sounds normal.  Abdominal: Soft. Bowel sounds are normal.  Musculoskeletal: Normal range of motion.       Right lower leg: Normal.       Left lower leg: Normal.  Neurological: She is alert.  Skin: Skin is warm and dry. Capillary refill takes less than 2 seconds.  Psychiatric: She has a normal mood and affect.  Nursing note and  vitals reviewed.    ED Treatments / Results  Labs (all labs ordered are listed, but only abnormal results are displayed) Labs Reviewed  BASIC METABOLIC PANEL - Abnormal; Notable for the following components:      Result Value   Glucose, Bld 105 (*)    All other components within normal limits  CBC - Abnormal; Notable for the following components:   Platelets 130 (*)    All other components within normal limits  URINALYSIS, ROUTINE W REFLEX MICROSCOPIC - Abnormal; Notable for the following components:   Color, Urine STRAW (*)    Ketones, ur 5 (*)    All other components within normal limits  HEPATIC FUNCTION PANEL - Abnormal; Notable for the following components:   AST 81 (*)    Total Bilirubin  1.3 (*)    Indirect Bilirubin 1.1 (*)    All other components within normal limits  I-STAT TROPONIN, ED  I-STAT TROPONIN, ED    EKG  EKG Interpretation  Date/Time:  Tuesday December 24 2016 09:42:58 EST Ventricular Rate:  68 PR Interval:  130 QRS Duration: 82 QT Interval:  428 QTC Calculation: 455 R Axis:   70 Text Interpretation:  Normal sinus rhythm Normal ECG Confirmed by Jacalyn LefevreHaviland, River Ambrosio [phone]([id number]) on 12/24/2016 11:57:23 AM       Radiology Dg Chest 2 View  Result Date: 12/24/2016 CLINICAL DATA:  Headache. EXAM: CHEST  2 VIEW COMPARISON:  01/23/2016 . FINDINGS: Mediastinum and hilar structures are normal. Calcified pulmonary nodule right upper lung consistent calcified granuloma. No focal alveolar infiltrate. No pleural effusion or pneumothorax. Heart size normal. Degenerative changes thoracic spine. Surgical clips right upper quadrant . IMPRESSION: 1. Calcified nodular density right upper lung consistent with granuloma. No change from prior exam. 2. No acute cardiopulmonary disease . Electronically Signed   By: Maisie Fushomas  Register   On: 12/24/2016 10:19   Ct Head Wo Contrast  Result Date: 12/24/2016 CLINICAL DATA:  Headache, nausea, vomiting, and dizziness beginning this morning. EXAM: CT HEAD WITHOUT CONTRAST TECHNIQUE: Contiguous axial images were obtained from the base of the skull through the vertex without intravenous contrast. COMPARISON:  01/23/2016 FINDINGS: Brain: There is no evidence of acute infarct, intracranial hemorrhage, mass, midline shift, or extra-axial fluid collection. The ventricles and sulci are within normal limits for age. Periventricular white matter hypodensity is unchanged and nonspecific but may reflect minimal chronic small vessel ischemia. Vascular: Calcified atherosclerosis at the skullbase. No hyperdense vessel. Skull: No fracture or focal osseous lesion. Sinuses/Orbits: Minimal mucosal thickening in the paranasal sinuses. Chronic right mastoid effusion.  Unremarkable orbits. Other: None. IMPRESSION: No evidence of acute intracranial abnormality. Electronically Signed   By: Sebastian AcheAllen  Grady M.D.   On: 12/24/2016 12:38    Procedures Procedures (including critical care time)  Medications Ordered in ED Medications  sodium chloride 0.9 % bolus 1,000 mL (0 mLs Intravenous Stopped 12/24/16 1432)  ondansetron (ZOFRAN) injection 4 mg (4 mg Intravenous Given 12/24/16 1240)     Initial Impression / Assessment and Plan / ED Course  I have reviewed the triage vital signs and the nursing notes.  Pertinent labs & imaging results that were available during my care of the patient were reviewed by me and considered in my medical decision making (see chart for details).     Pt is feeling much better after IVFs.  She is able to get up and move around without problems.  Pt's 2 sets of troponins are negative.  Pt is stable for d/c.  Final Clinical Impressions(s) / ED Diagnoses   Final diagnoses:  Nonspecific chest pain  Dehydration    ED Discharge Orders        Ordered    ondansetron (ZOFRAN ODT) 4 MG disintegrating tablet  Every 8 hours PRN     12/24/16 1455       Jacalyn Lefevre, MD 12/24/16 1457

## 2016-12-24 NOTE — ED Triage Notes (Signed)
Pt to ER for evaluation of headache, nausea, vomiting, dizziness, and chest tightness that began Sunday. Reports no pain right now. Pt reports extreme fatigue. Hx of hypertension.

## 2016-12-24 NOTE — ED Notes (Signed)
Patient vomiting on return from CT.

## 2017-01-23 ENCOUNTER — Ambulatory Visit
Admission: RE | Admit: 2017-01-23 | Discharge: 2017-01-23 | Disposition: A | Discharge status: Home / Self Care | Payer: Medicare Other | Source: Ambulatory Visit | Attending: Family Medicine | Admitting: Family Medicine

## 2017-01-23 ENCOUNTER — Other Ambulatory Visit: Payer: Self-pay | Admitting: Family Medicine

## 2017-01-23 DIAGNOSIS — M545 Low back pain: Secondary | ICD-10-CM | POA: Diagnosis not present

## 2017-01-23 DIAGNOSIS — M79672 Pain in left foot: Secondary | ICD-10-CM

## 2017-01-23 DIAGNOSIS — I10 Essential (primary) hypertension: Secondary | ICD-10-CM | POA: Diagnosis not present

## 2017-01-23 DIAGNOSIS — M810 Age-related osteoporosis without current pathological fracture: Secondary | ICD-10-CM | POA: Diagnosis not present

## 2017-01-23 DIAGNOSIS — D696 Thrombocytopenia, unspecified: Secondary | ICD-10-CM | POA: Diagnosis not present

## 2017-01-23 DIAGNOSIS — M19072 Primary osteoarthritis, left ankle and foot: Secondary | ICD-10-CM | POA: Diagnosis not present

## 2017-01-23 DIAGNOSIS — Z681 Body mass index (BMI) 19 or less, adult: Secondary | ICD-10-CM | POA: Diagnosis not present

## 2017-01-23 DIAGNOSIS — J301 Allergic rhinitis due to pollen: Secondary | ICD-10-CM | POA: Diagnosis not present

## 2017-01-23 DIAGNOSIS — E559 Vitamin D deficiency, unspecified: Secondary | ICD-10-CM | POA: Diagnosis not present

## 2017-02-20 DIAGNOSIS — M79671 Pain in right foot: Secondary | ICD-10-CM | POA: Diagnosis not present

## 2017-02-20 DIAGNOSIS — M722 Plantar fascial fibromatosis: Secondary | ICD-10-CM | POA: Diagnosis not present

## 2017-03-06 DIAGNOSIS — M722 Plantar fascial fibromatosis: Secondary | ICD-10-CM | POA: Diagnosis not present

## 2017-03-06 DIAGNOSIS — M79671 Pain in right foot: Secondary | ICD-10-CM | POA: Diagnosis not present

## 2017-04-03 DIAGNOSIS — H9193 Unspecified hearing loss, bilateral: Secondary | ICD-10-CM | POA: Diagnosis not present

## 2017-04-03 DIAGNOSIS — M79661 Pain in right lower leg: Secondary | ICD-10-CM | POA: Diagnosis not present

## 2017-04-03 DIAGNOSIS — M79662 Pain in left lower leg: Secondary | ICD-10-CM | POA: Diagnosis not present

## 2017-04-03 DIAGNOSIS — M810 Age-related osteoporosis without current pathological fracture: Secondary | ICD-10-CM | POA: Diagnosis not present

## 2017-05-05 DIAGNOSIS — H90A31 Mixed conductive and sensorineural hearing loss, unilateral, right ear with restricted hearing on the contralateral side: Secondary | ICD-10-CM | POA: Diagnosis not present

## 2017-05-05 DIAGNOSIS — H90A22 Sensorineural hearing loss, unilateral, left ear, with restricted hearing on the contralateral side: Secondary | ICD-10-CM | POA: Diagnosis not present

## 2017-07-25 DIAGNOSIS — E559 Vitamin D deficiency, unspecified: Secondary | ICD-10-CM | POA: Diagnosis not present

## 2017-07-25 DIAGNOSIS — M810 Age-related osteoporosis without current pathological fracture: Secondary | ICD-10-CM | POA: Diagnosis not present

## 2017-07-25 DIAGNOSIS — M545 Low back pain: Secondary | ICD-10-CM | POA: Diagnosis not present

## 2017-07-25 DIAGNOSIS — Z136 Encounter for screening for cardiovascular disorders: Secondary | ICD-10-CM | POA: Diagnosis not present

## 2017-07-25 DIAGNOSIS — I10 Essential (primary) hypertension: Secondary | ICD-10-CM | POA: Diagnosis not present

## 2017-07-25 DIAGNOSIS — Z23 Encounter for immunization: Secondary | ICD-10-CM | POA: Diagnosis not present

## 2017-07-25 DIAGNOSIS — D696 Thrombocytopenia, unspecified: Secondary | ICD-10-CM | POA: Diagnosis not present

## 2017-07-25 DIAGNOSIS — Z0000 Encounter for general adult medical examination without abnormal findings: Secondary | ICD-10-CM | POA: Diagnosis not present

## 2017-07-25 DIAGNOSIS — Z1389 Encounter for screening for other disorder: Secondary | ICD-10-CM | POA: Diagnosis not present

## 2017-10-14 DIAGNOSIS — M810 Age-related osteoporosis without current pathological fracture: Secondary | ICD-10-CM | POA: Diagnosis not present

## 2017-12-04 DIAGNOSIS — Z23 Encounter for immunization: Secondary | ICD-10-CM | POA: Diagnosis not present

## 2017-12-17 DIAGNOSIS — I10 Essential (primary) hypertension: Secondary | ICD-10-CM | POA: Diagnosis not present

## 2018-01-07 ENCOUNTER — Other Ambulatory Visit: Payer: Self-pay

## 2018-01-07 ENCOUNTER — Emergency Department (HOSPITAL_COMMUNITY): Payer: Medicare Other

## 2018-01-07 ENCOUNTER — Encounter (HOSPITAL_COMMUNITY): Payer: Self-pay

## 2018-01-07 ENCOUNTER — Emergency Department (HOSPITAL_COMMUNITY)
Admission: EM | Admit: 2018-01-07 | Discharge: 2018-01-07 | Disposition: A | Discharge status: Home / Self Care | Payer: Medicare Other | Attending: Emergency Medicine | Admitting: Emergency Medicine

## 2018-01-07 DIAGNOSIS — I10 Essential (primary) hypertension: Secondary | ICD-10-CM | POA: Insufficient documentation

## 2018-01-07 DIAGNOSIS — R05 Cough: Secondary | ICD-10-CM | POA: Diagnosis not present

## 2018-01-07 DIAGNOSIS — Z79899 Other long term (current) drug therapy: Secondary | ICD-10-CM | POA: Diagnosis not present

## 2018-01-07 DIAGNOSIS — R079 Chest pain, unspecified: Secondary | ICD-10-CM | POA: Diagnosis not present

## 2018-01-07 DIAGNOSIS — R197 Diarrhea, unspecified: Secondary | ICD-10-CM | POA: Diagnosis not present

## 2018-01-07 LAB — CBC WITH DIFFERENTIAL/PLATELET
Abs Immature Granulocytes: 0.01 10*3/uL (ref 0.00–0.07)
BASOS ABS: 0 10*3/uL (ref 0.0–0.1)
Basophils Relative: 1 %
EOS ABS: 0.1 10*3/uL (ref 0.0–0.5)
Eosinophils Relative: 1 %
HCT: 34.6 % — ABNORMAL LOW (ref 36.0–46.0)
Hemoglobin: 11.2 g/dL — ABNORMAL LOW (ref 12.0–15.0)
IMMATURE GRANULOCYTES: 0 %
LYMPHS ABS: 1.9 10*3/uL (ref 0.7–4.0)
LYMPHS PCT: 33 %
MCH: 28.9 pg (ref 26.0–34.0)
MCHC: 32.4 g/dL (ref 30.0–36.0)
MCV: 89.2 fL (ref 80.0–100.0)
Monocytes Absolute: 0.5 10*3/uL (ref 0.1–1.0)
Monocytes Relative: 8 %
NEUTROS PCT: 57 %
NRBC: 0 % (ref 0.0–0.2)
Neutro Abs: 3.4 10*3/uL (ref 1.7–7.7)
Platelets: 155 10*3/uL (ref 150–400)
RBC: 3.88 MIL/uL (ref 3.87–5.11)
RDW: 11.9 % (ref 11.5–15.5)
WBC: 5.9 10*3/uL (ref 4.0–10.5)

## 2018-01-07 LAB — INFLUENZA PANEL BY PCR (TYPE A & B)
INFLBPCR: NEGATIVE
Influenza A By PCR: NEGATIVE

## 2018-01-07 LAB — COMPREHENSIVE METABOLIC PANEL
ALT: 24 U/L (ref 0–44)
ANION GAP: 9 (ref 5–15)
AST: 43 U/L — ABNORMAL HIGH (ref 15–41)
Albumin: 4 g/dL (ref 3.5–5.0)
Alkaline Phosphatase: 67 U/L (ref 38–126)
BILIRUBIN TOTAL: 0.7 mg/dL (ref 0.3–1.2)
BUN: 14 mg/dL (ref 8–23)
CHLORIDE: 96 mmol/L — AB (ref 98–111)
CO2: 23 mmol/L (ref 22–32)
Calcium: 9.3 mg/dL (ref 8.9–10.3)
Creatinine, Ser: 0.83 mg/dL (ref 0.44–1.00)
GFR calc Af Amer: >60 mL/min (ref >60)
GFR calc non Af Amer: >60 mL/min (ref >60)
GLUCOSE: 105 mg/dL — AB (ref 70–99)
POTASSIUM: 3.7 mmol/L (ref 3.5–5.1)
Sodium: 128 mmol/L — ABNORMAL LOW (ref 135–145)
TOTAL PROTEIN: 7.5 g/dL (ref 6.5–8.1)

## 2018-01-07 LAB — URINALYSIS, ROUTINE W REFLEX MICROSCOPIC
BACTERIA UA: NONE SEEN
Bilirubin Urine: NEGATIVE
GLUCOSE, UA: NEGATIVE mg/dL
Ketones, ur: NEGATIVE mg/dL
Leukocytes, UA: NEGATIVE
Nitrite: NEGATIVE
PH: 7 (ref 5.0–8.0)
Protein, ur: NEGATIVE mg/dL
SPECIFIC GRAVITY, URINE: 1.003 — AB (ref 1.005–1.030)

## 2018-01-07 LAB — I-STAT TROPONIN, ED: TROPONIN I, POC: 0.01 ng/mL (ref 0.00–0.08)

## 2018-01-07 MED ORDER — IBUPROFEN 400 MG PO TABS: 400.0000 mg | ORAL_TABLET | Freq: Once | ORAL | Status: DC

## 2018-01-07 MED ORDER — SODIUM CHLORIDE 0.9 % IV BOLUS
1000.0000 mL | Freq: Once | INTRAVENOUS | Status: AC
Start: 2018-01-07 — End: 2018-01-07
  Administered 2018-01-07: 1000 mL via INTRAVENOUS

## 2018-01-07 MED ORDER — LOSARTAN POTASSIUM 25 MG PO TABS
12.5000 mg | ORAL_TABLET | Freq: Once | ORAL | Status: AC
  Administered 2018-01-07: 12.5 mg via ORAL
  Filled 2018-01-07: qty 0.5

## 2018-01-07 MED ORDER — LABETALOL HCL 5 MG/ML IV SOLN
5.0000 mg | Freq: Once | INTRAVENOUS | Status: DC
  Filled 2018-01-07: qty 4

## 2018-01-07 NOTE — ED Notes (Addendum)
Pt ambulated to restroom independently with family member at side without complication. Steady gait noted.

## 2018-01-07 NOTE — ED Triage Notes (Signed)
Pt states she has been feeling "tight" all over. Hx of hypertension. BP with 176/84 with EMS. HR 74, 99%. Alert. Non english speaking. CBG 118

## 2018-01-07 NOTE — ED Notes (Signed)
Patient verbalizes understanding of discharge instructions. Opportunity for questioning and answers were provided. Armband removed by staff, pt discharged from ED ambulatory w/ family  

## 2018-01-07 NOTE — ED Notes (Signed)
Patient is resting comfortably. 

## 2018-01-07 NOTE — ED Provider Notes (Addendum)
[facility] EMERGENCY DEPARTMENT Provider Note   CSN: [id number] Arrival date & time: 01/07/18  1030     History   Chief Complaint Chief Complaint  Patient presents with  . Hypertension    HPI Katie [name] is a 84 y.o. female.  A language interpreter was used (Family member).     Katie [name] is a 84 y.o. female, with a history of HTN and hypercholesterolemia, presenting to the ED with hypertension noted today.  She states she is felt "heavy all over" for about the past 3 to 4 days.  She endorses a dry cough that developed a few days ago.  She began to have loose stools today.   Despite these other complaints, she insists her main concern is her blood pressure.  She has been taking her medication as prescribed.  Denies known fever, chills, nausea/vomiting, hematochezia/melena, shortness of breath, chest pain, abdominal pain, urinary symptoms, neuro deficits, headache, vision changes, epistaxis, or any other complaints.   Past Medical History:  Diagnosis Date  . Back pain   . Hypercholesteremia   . Hypertension   . Osteoporosis   . Vitamin D deficiency     There are no active problems to display for this patient.   Past Surgical History:  Procedure Laterality Date  . GALLBLADDER SURGERY       OB History   [name]      Home Medications    Prior to Admission medications   Medication Sig Start Date End Date Taking? Authorizing Provider  acetaminophen (TYLENOL) 500 MG tablet Take 500 mg every 6 (six) hours as needed by mouth for mild pain or headache.   Yes [provider]  alendronate (FOSAMAX) 70 MG tablet Take 70 mg by mouth every Thursday. Take with a full glass of water on an empty stomach.    Yes [provider]  cetirizine (ZYRTEC) 10 MG tablet Take 10 mg by mouth daily.   Yes [provider]  hydrochlorothiazide (HYDRODIURIL) 25 MG tablet Take 1 tablet (25 mg total) by mouth daily. 01/23/16  Yes  Melene Plan, DO  losartan (COZAAR) 25 MG tablet Take 12.5 mg by mouth daily.   Yes [provider]  Menthol-Methyl Salicylate (MUSCLE RUB EX) Apply 1 application as needed topically (muscle aches).   Yes [provider]  Vitamin D, Ergocalciferol, (DRISDOL) 50000 units CAPS capsule Take 5,000 Units daily by mouth.    Yes [provider]  fluticasone (FLONASE) 50 MCG/ACT nasal spray Place 1 spray into the nose 2 (two) times daily. Patient not taking: Reported on 12/24/2016 06/30/12   Linna Hoff, MD  ondansetron (ZOFRAN ODT) 4 MG disintegrating tablet Take 1 tablet (4 mg total) every 8 (eight) hours as needed by mouth. Patient not taking: Reported on 01/07/2018 12/24/16   Jacalyn Lefevre, MD    Family History History reviewed. No pertinent family history.  Social History Social History   Tobacco Use  . Smoking status: Never Smoker  . Smokeless tobacco: Never Used  Substance Use Topics  . Alcohol use: No  . Drug use: No     Allergies   Ampicillin   Review of Systems Review of Systems  Constitutional: Negative for chills, diaphoresis and fever.       Feeling "heavy all over."  HENT: Negative for congestion, sore throat and trouble swallowing.   Respiratory: Positive for cough. Negative for shortness of breath.   Cardiovascular: Negative for chest pain.  Gastrointestinal: Positive  for diarrhea. Negative for abdominal pain, blood in stool, nausea and vomiting.  Genitourinary: Negative for dysuria, flank pain, frequency and hematuria.  Musculoskeletal: Negative for back pain and neck pain.  Neurological: Negative for dizziness, syncope, weakness, light-headedness, numbness and headaches.  All other systems reviewed and are negative.    Physical Exam Updated Vital Signs BP (!) 151/73 (BP Location: Right Arm)   Pulse 76   Temp 98.1 F (36.7 C) (Oral)   Resp 17   SpO2 100%   Physical Exam  Constitutional: She is oriented to person, place, and  time. She appears well-developed and well-nourished. No distress.  HENT:  Head: Normocephalic and atraumatic.  Mouth/Throat: Oropharynx is clear and moist.  Eyes: Pupils are equal, round, and reactive to light. Conjunctivae and EOM are normal.  Neck: Neck supple.  Cardiovascular: Normal rate, regular rhythm, normal heart sounds and intact distal pulses.  Pulmonary/Chest: Effort normal and breath sounds normal. No respiratory distress.  No increased work of breathing.  Speaks in full sentences without difficulty.  Abdominal: Soft. There is no tenderness. There is no guarding.  Musculoskeletal: She exhibits no edema.  Lymphadenopathy:    She has no cervical adenopathy.  Neurological: She is alert and oriented to person, place, and time.  Sensation grossly intact to light touch in the extremities. Strength 5/5 in all extremities. No gait disturbance. Coordination intact. Cranial nerves III-XII grossly intact. No facial droop.   Skin: Skin is warm and dry. She is not diaphoretic.  Psychiatric: She has a normal mood and affect. Her behavior is normal.  Nursing note and vitals reviewed.    ED Treatments / Results  Labs (all labs ordered are listed, but only abnormal results are displayed) Labs Reviewed  COMPREHENSIVE METABOLIC PANEL - Abnormal; Notable for the following components:      Result Value   Sodium 128 (*)    Chloride 96 (*)    Glucose, Bld 105 (*)    AST 43 (*)    All other components within normal limits  CBC WITH DIFFERENTIAL/PLATELET - Abnormal; Notable for the following components:   Hemoglobin 11.2 (*)    HCT 34.6 (*)    All other components within normal limits  URINALYSIS, ROUTINE W REFLEX MICROSCOPIC - Abnormal; Notable for the following components:   Color, Urine COLORLESS (*)    Specific Gravity, Urine 1.003 (*)    Hgb urine dipstick MODERATE (*)    All other components within normal limits  INFLUENZA PANEL BY PCR (TYPE A & B)  I-STAT TROPONIN, ED     EKG EKG Interpretation  Date/Time:  Wednesday January 07 2018 11:19:49 EST Ventricular Rate:  74 PR Interval:    QRS Duration: 94 QT Interval:  414 QTC Calculation: 460 R Axis:   68 Text Interpretation:  Sinus rhythm Atrial premature complex Abnormal R-wave progression, early transition Borderline T wave abnormalities No significant change since last tracing Confirmed by Marily Memos [phone]) on 01/07/2018 11:32:47 AM   Radiology Dg Chest 2 View  Result Date: 01/07/2018 CLINICAL DATA:  Chest pain. Hypertension. EXAM: CHEST - 2 VIEW COMPARISON:  12/24/2016 FINDINGS: The heart size and pulmonary vascularity are normal. Calcified granulomas in the right lung apex, stable. Lungs are otherwise clear. No effusions. Aortic atherosclerosis. No significant bone abnormality. IMPRESSION: 1. No active cardiopulmonary disease. 2.  Aortic Atherosclerosis (ICD10-I70.0). Electronically Signed   By: Francene Boyers M.D.   On: 01/07/2018 14:16    Procedures Procedures (including critical care time)  Medications Ordered in  ED Medications  sodium chloride 0.9 % bolus 1,000 mL (0 mLs Intravenous Stopped 01/07/18 1602)  losartan (COZAAR) tablet 12.5 mg (12.5 mg Oral Given 01/07/18 1429)     Initial Impression / Assessment and Plan / ED Course  I have reviewed the triage vital signs and the nursing notes.  Pertinent labs & imaging results that were available during my care of the patient were reviewed by me and considered in my medical decision making (see chart for details).  Clinical Course as of Jan 09 808  Wed Jan 07, 2018  1320 Labetalol was mistakenly marked as given.  Patient has not yet received labetalol, but I instructed the nurse to hold this medication as the patient's blood pressure has significantly improved on its own.  We will administer an additional dose of her losartan instead.   [SJ]  1549 Patient states she feels much better.   [SJ]    Clinical Course User Index [SJ]  Joy, Shawn C, PA-C    Patient presents with overall not feeling well.  Mildly febrile. She has had mild cough and loose stool. Patient is nontoxic appearing, not tachycardic, not tachypneic, not hypotensive, maintains excellent SPO2 on room air, and is in no apparent distress.  She is also concerned with her blood pressure.  Negative troponin, no evidence of renal dysfunction, and no neurologic dysfunction; doubt hypertensive emergency. Her sodium and chloride are lower than normal, which could be from dehydration, her HCTZ, or a combination. Patient voiced significant improvement with IV fluids. The patient was given instructions for home care as well as return precautions. Patient voices understanding of these instructions, accepts the plan, and is comfortable with discharge.  Findings and plan of care discussed with Marily MemosJason Mesner, MD.   Vitals:   01/07/18 1200 01/07/18 1230 01/07/18 1245 01/07/18 1637  BP: (!) 176/87 (!) 161/79 (!) 153/78 (!) 151/73  Pulse: 71 70 71 76  Resp: (!) 23 14 12 17   Temp:    98.1 F (36.7 C)  TempSrc:    Oral  SpO2: 100% 100% 100% 100%      Final Clinical Impressions(s) / ED Diagnoses   Final diagnoses:  Hypertension, unspecified type    ED Discharge Orders    [name]       Anselm PancoastJoy, Shawn C, PA-C 01/08/18 0810    Anselm PancoastJoy, Shawn C, PA-C 01/08/18 [id number]    Marily MemosMesner, Jason, MD 01/08/18 [phone]

## 2018-01-07 NOTE — Discharge Instructions (Addendum)
There is evidence of some possible dehydration.  Please work to stay well-hydrated by drinking plenty of water.  Your lab work and imaging were otherwise reassuring.  Please follow-up with your primary care provider soon as possible on this matter.  Return to the ED for worsening symptoms, shortness of breath, chest pain, abdominal pain, persistent vomiting, or any other major concerns.  Hand washing: Wash your hands throughout the day, but especially before and after touching the face, using the restroom, sneezing, coughing, or touching surfaces that have been coughed or sneezed upon. Hydration: Symptoms will be intensified and complicated by dehydration. Dehydration can also extend the duration of symptoms. Drink plenty of fluids and get plenty of rest. You should be drinking at least half a liter of water an hour to stay hydrated. Electrolyte drinks (ex. Gatorade, Powerade, Pedialyte) are also encouraged. You should be drinking enough fluids to make your urine light yellow, almost clear. If this is not the case, you are not drinking enough water. Please note that some of the treatments indicated below will not be effective if you are not adequately hydrated. Diet: Please concentrate on hydration, however, you may introduce food slowly.  Start with a clear liquid diet, progressed to a full liquid diet, and then bland solids as you are able. Pain or fever: Ibuprofen, Naproxen, or acetaminophen (generic for Tylenol) for pain or fever.  Diarrhea: May use medications such as loperamide (Imodium) or Bismuth subsalicylate (Pepto-Bismol). Follow up: Follow up with a primary care provider within the next two weeks should symptoms fail to resolve. Return: Return to the ED for significantly worsening symptoms, shortness of breath, persistent vomiting, large amounts of blood in stool, or any other major concerns.  For prescription assistance, may try using prescription discount sites or apps, such as  goodrx.com

## 2018-03-13 DIAGNOSIS — I10 Essential (primary) hypertension: Secondary | ICD-10-CM | POA: Diagnosis not present

## 2018-03-13 DIAGNOSIS — L853 Xerosis cutis: Secondary | ICD-10-CM | POA: Diagnosis not present

## 2018-03-13 DIAGNOSIS — M25561 Pain in right knee: Secondary | ICD-10-CM | POA: Diagnosis not present

## 2018-03-13 DIAGNOSIS — E559 Vitamin D deficiency, unspecified: Secondary | ICD-10-CM | POA: Diagnosis not present

## 2018-03-13 DIAGNOSIS — M810 Age-related osteoporosis without current pathological fracture: Secondary | ICD-10-CM | POA: Diagnosis not present

## 2018-07-16 DIAGNOSIS — E441 Mild protein-calorie malnutrition: Secondary | ICD-10-CM | POA: Diagnosis not present

## 2018-07-16 DIAGNOSIS — I10 Essential (primary) hypertension: Secondary | ICD-10-CM | POA: Diagnosis not present

## 2018-10-21 DIAGNOSIS — F4321 Adjustment disorder with depressed mood: Secondary | ICD-10-CM | POA: Diagnosis not present

## 2018-10-21 DIAGNOSIS — M810 Age-related osteoporosis without current pathological fracture: Secondary | ICD-10-CM | POA: Diagnosis not present

## 2018-10-21 DIAGNOSIS — M545 Low back pain: Secondary | ICD-10-CM | POA: Diagnosis not present

## 2018-10-21 DIAGNOSIS — E559 Vitamin D deficiency, unspecified: Secondary | ICD-10-CM | POA: Diagnosis not present

## 2018-10-21 DIAGNOSIS — D696 Thrombocytopenia, unspecified: Secondary | ICD-10-CM | POA: Diagnosis not present

## 2018-10-21 DIAGNOSIS — R7301 Impaired fasting glucose: Secondary | ICD-10-CM | POA: Diagnosis not present

## 2018-10-21 DIAGNOSIS — M25561 Pain in right knee: Secondary | ICD-10-CM | POA: Diagnosis not present

## 2018-10-21 DIAGNOSIS — Z23 Encounter for immunization: Secondary | ICD-10-CM | POA: Diagnosis not present

## 2018-10-21 DIAGNOSIS — I10 Essential (primary) hypertension: Secondary | ICD-10-CM | POA: Diagnosis not present

## 2019-01-31 IMAGING — DX DG CHEST 2V
2 series · 2 of 2 positions shown · non-contrast
Comparison: 12/24/2016

CLINICAL DATA: Chest pain. Hypertension.

EXAM:
CHEST - 2 VIEW

[w chest lat]
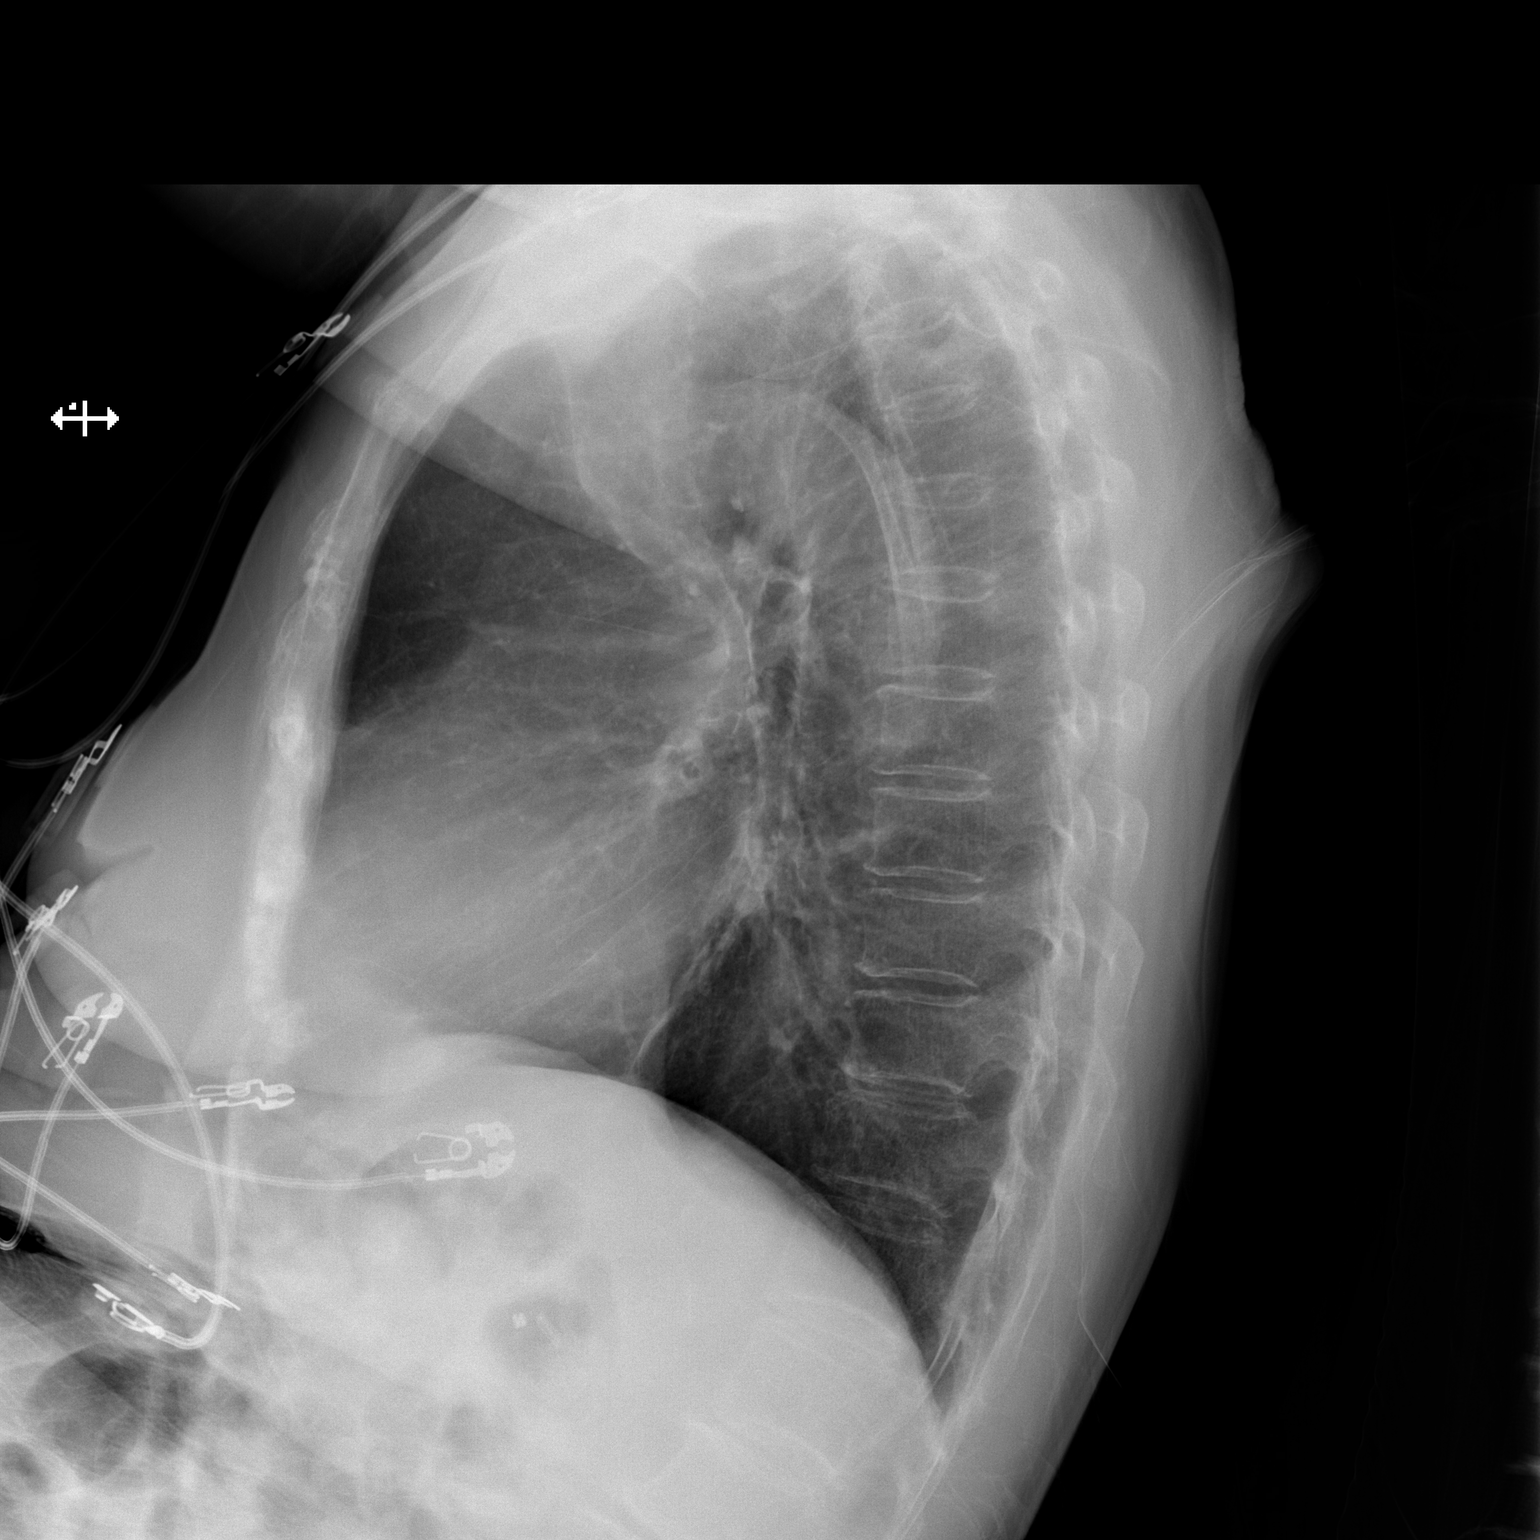

[x chest ap]
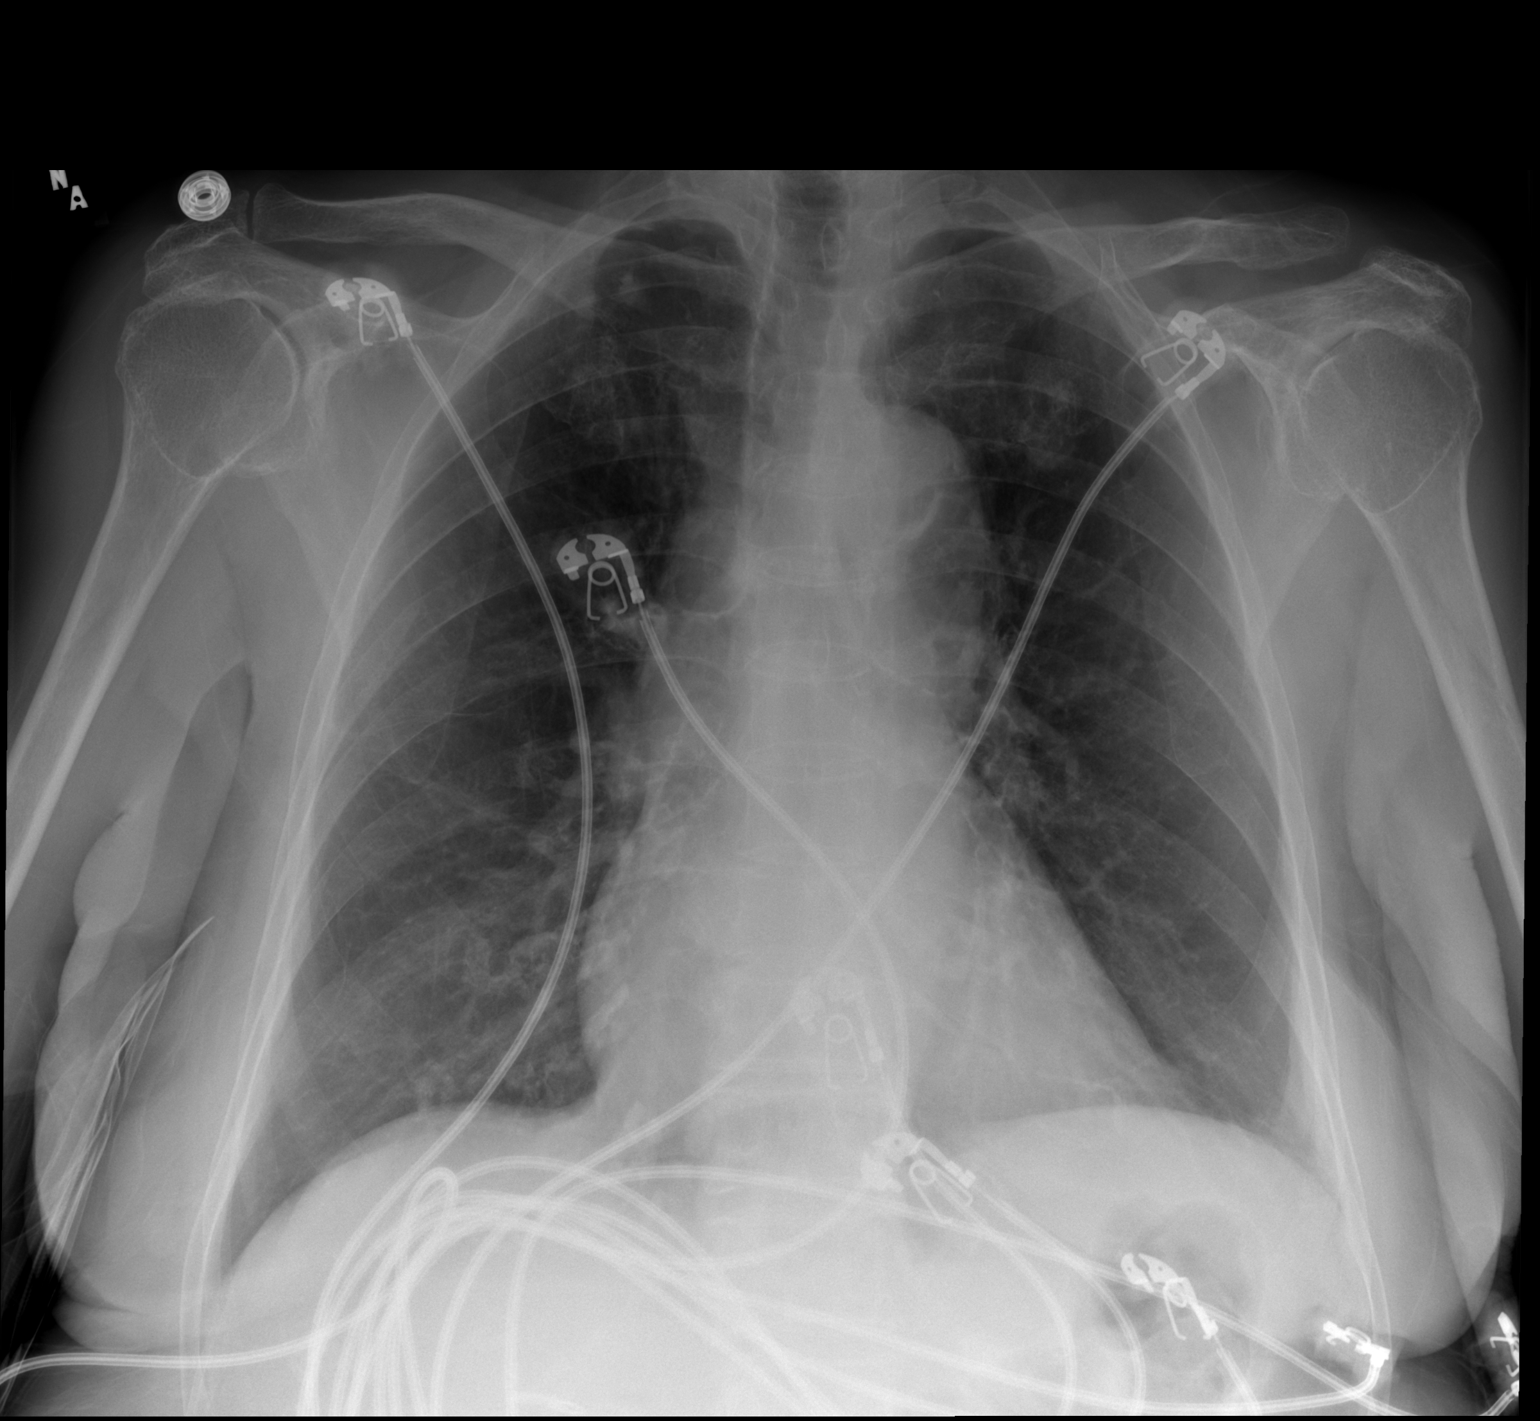

[2 of 2 positions shown; findings below may reference images not displayed]

FINDINGS: The heart size and pulmonary vascularity are normal. Calcified
granulomas in the right lung apex, stable. Lungs are otherwise
clear. No effusions.

Aortic atherosclerosis.

No significant bone abnormality.
IMPRESSION: 1. No active cardiopulmonary disease.
2.  Aortic Atherosclerosis ([id number]).

## 2019-06-18 ENCOUNTER — Emergency Department (HOSPITAL_COMMUNITY): Payer: Medicare Other

## 2019-06-18 ENCOUNTER — Emergency Department (HOSPITAL_COMMUNITY)
Admission: EM | Admit: 2019-06-18 | Discharge: 2019-06-19 | Disposition: A | Discharge status: Home / Self Care | Payer: Medicare Other | Attending: Emergency Medicine | Admitting: Emergency Medicine

## 2019-06-18 ENCOUNTER — Encounter (HOSPITAL_COMMUNITY): Payer: Self-pay | Admitting: Emergency Medicine

## 2019-06-18 ENCOUNTER — Other Ambulatory Visit: Payer: Self-pay

## 2019-06-18 DIAGNOSIS — R0602 Shortness of breath: Secondary | ICD-10-CM | POA: Diagnosis not present

## 2019-06-18 DIAGNOSIS — R519 Headache, unspecified: Secondary | ICD-10-CM | POA: Diagnosis not present

## 2019-06-18 DIAGNOSIS — R200 Anesthesia of skin: Secondary | ICD-10-CM | POA: Diagnosis not present

## 2019-06-18 DIAGNOSIS — R0789 Other chest pain: Secondary | ICD-10-CM | POA: Insufficient documentation

## 2019-06-18 DIAGNOSIS — M7918 Myalgia, other site: Secondary | ICD-10-CM | POA: Insufficient documentation

## 2019-06-18 DIAGNOSIS — I10 Essential (primary) hypertension: Secondary | ICD-10-CM | POA: Insufficient documentation

## 2019-06-18 DIAGNOSIS — R9431 Abnormal electrocardiogram [ECG] [EKG]: Secondary | ICD-10-CM | POA: Diagnosis not present

## 2019-06-18 DIAGNOSIS — R52 Pain, unspecified: Secondary | ICD-10-CM

## 2019-06-18 DIAGNOSIS — M791 Myalgia, unspecified site: Secondary | ICD-10-CM | POA: Diagnosis not present

## 2019-06-18 DIAGNOSIS — R5383 Other fatigue: Secondary | ICD-10-CM | POA: Diagnosis not present

## 2019-06-18 DIAGNOSIS — R079 Chest pain, unspecified: Secondary | ICD-10-CM | POA: Diagnosis not present

## 2019-06-18 LAB — URINALYSIS, ROUTINE W REFLEX MICROSCOPIC
Bacteria, UA: NONE SEEN
Bilirubin Urine: NEGATIVE
Glucose, UA: NEGATIVE mg/dL
Hgb urine dipstick: NEGATIVE
Ketones, ur: NEGATIVE mg/dL
Nitrite: NEGATIVE
Protein, ur: NEGATIVE mg/dL
Specific Gravity, Urine: 1.01 (ref 1.005–1.030)
pH: 6 (ref 5.0–8.0)

## 2019-06-18 LAB — BASIC METABOLIC PANEL
Anion gap: 8 (ref 5–15)
BUN: 12 mg/dL (ref 8–23)
CO2: 25 mmol/L (ref 22–32)
Calcium: 9.2 mg/dL (ref 8.9–10.3)
Chloride: 96 mmol/L — ABNORMAL LOW (ref 98–111)
Creatinine, Ser: 0.89 mg/dL (ref 0.44–1.00)
GFR calc Af Amer: >60 mL/min (ref >60)
GFR calc non Af Amer: 60 mL/min — ABNORMAL LOW (ref >60)
Glucose, Bld: 108 mg/dL — ABNORMAL HIGH (ref 70–99)
Potassium: 3.9 mmol/L (ref 3.5–5.1)
Sodium: 129 mmol/L — ABNORMAL LOW (ref 135–145)

## 2019-06-18 LAB — CBC
HCT: 37.7 % (ref 36.0–46.0)
Hemoglobin: 12.3 g/dL (ref 12.0–15.0)
MCH: 29.6 pg (ref 26.0–34.0)
MCHC: 32.6 g/dL (ref 30.0–36.0)
MCV: 90.6 fL (ref 80.0–100.0)
Platelets: 142 10*3/uL — ABNORMAL LOW (ref 150–400)
RBC: 4.16 MIL/uL (ref 3.87–5.11)
RDW: 12.3 % (ref 11.5–15.5)
WBC: 5.8 10*3/uL (ref 4.0–10.5)
nRBC: 0 % (ref 0.0–0.2)

## 2019-06-18 LAB — TROPONIN I (HIGH SENSITIVITY)
Troponin I (High Sensitivity): 7 ng/L (ref <18)
Troponin I (High Sensitivity): 10 ng/L (ref <18)

## 2019-06-18 MED ORDER — SODIUM CHLORIDE 0.9% FLUSH: 3.0000 mL | Freq: Once | INTRAVENOUS | Status: DC

## 2019-06-18 NOTE — ED Provider Notes (Signed)
[facility] Emergency Department Provider Note MRN:  [id number]  Arrival date & time: 06/18/19     Chief Complaint   Numbness   History of Present Illness   Katie [name] is a 86 y.o. year-old female with a history of hypertension presenting to the ED with chief complaint of numbness.  History obtained with the help of video [place] interpreter.  Patient explains that she received Covid vaccine injection on 5-4 and since then has been feeling strange endorsing generalized aches, endorsing what she calls "numbness".  She explains that she can feel normally when she is touched, but she feels like her "bones are numb".  She proceeds to have a positive review of systems for chest pain, shortness of breath, urinating issues, headache at the top of the head.  Review of Systems  A complete 10 system review of systems was obtained and all systems are negative except as noted in the HPI and PMH.   Patient's Health History    Past Medical History:  Diagnosis Date  . Back pain   . Hypercholesteremia   . Hypertension   . Osteoporosis   . Vitamin D deficiency     Past Surgical History:  Procedure Laterality Date  . GALLBLADDER SURGERY      History reviewed. No pertinent family history.  Social History   Socioeconomic History  . Marital status: Single    Spouse name: Not on file  . Number of children: Not on file  . Years of education: Not on file  . Highest education level: Not on file  Occupational History  . Not on file  Tobacco Use  . Smoking status: Never Smoker  . Smokeless tobacco: Never Used  Substance and Sexual Activity  . Alcohol use: No  . Drug use: No  . Sexual activity: Not on file  Other Topics Concern  . Not on file  Social History Narrative  . Not on file   Social Determinants of Health   Financial Resource Strain:   . Difficulty of Paying Living Expenses:   Food Insecurity:   . Worried About Charity fundraiser in the  Last Year:   . Arboriculturist in the Last Year:   Transportation Needs:   . Film/video editor (Medical):   Marland Kitchen Lack of Transportation (Non-Medical):   Physical Activity:   . Days of Exercise per Week:   . Minutes of Exercise per Session:   Stress:   . Feeling of Stress :   Social Connections:   . Frequency of Communication with Friends and Family:   . Frequency of Social Gatherings with Friends and Family:   . Attends Religious Services:   . Active Member of Clubs or Organizations:   . Attends Archivist Meetings:   Marland Kitchen Marital Status:   Intimate Partner Violence:   . Fear of Current or Ex-Partner:   . Emotionally Abused:   Marland Kitchen Physically Abused:   . Sexually Abused:      Physical Exam   Vitals:   06/18/19 2138 06/18/19 2145  BP: (!) 159/79 (!) 179/75  Pulse: 89 90  Resp: 20 12  Temp:    SpO2: 99% 100%    CONSTITUTIONAL: Well-appearing, NAD NEURO:  Alert and oriented x 2, normal and symmetric strength and sensation, normal coordination, normal speech EYES:  eyes equal and reactive ENT/NECK:  no LAD, no JVD CARDIO: Regular rate, well-perfused, normal S1 and S2 PULM:  CTAB no wheezing or rhonchi  GI/GU:  normal bowel sounds, non-distended, non-tender MSK/SPINE:  No gross deformities, no edema SKIN:  no rash, atraumatic PSYCH:  Appropriate speech and behavior  *Additional and/or pertinent findings included in MDM below  Diagnostic and Interventional Summary    EKG Interpretation  Date/Time:  Friday Jun 18 2019 16:16:46 EDT Ventricular Rate:  73 PR Interval:  150 QRS Duration: 74 QT Interval:  404 QTC Calculation: 445 R Axis:   64 Text Interpretation: Normal sinus rhythm Nonspecific T wave abnormality Abnormal ECG Confirmed by Gerlene Fee [phone]) on 06/18/2019 8:01:13 PM      Labs Reviewed  BASIC METABOLIC PANEL - Abnormal; Notable for the following components:      Result Value   Sodium 129 (*)    Chloride 96 (*)    Glucose, Bld 108 (*)    GFR  calc non Af Amer 60 (*)    All other components within normal limits  CBC - Abnormal; Notable for the following components:   Platelets 142 (*)    All other components within normal limits  URINALYSIS, ROUTINE W REFLEX MICROSCOPIC - Abnormal; Notable for the following components:   Leukocytes,Ua MODERATE (*)    All other components within normal limits  TROPONIN I (HIGH SENSITIVITY)  TROPONIN I (HIGH SENSITIVITY)    CT Head Wo Contrast  Final Result    DG Chest 2 View  Final Result      Medications  sodium chloride flush (NS) 0.9 % injection 3 mL (has no administration in time range)     Procedures  /  Critical Care Procedures  ED Course and Medical Decision Making  I have reviewed the triage vital signs, the nursing notes, and pertinent available records from the EMR.  Listed above are laboratory and imaging tests that I personally ordered, reviewed, and interpreted and then considered in my medical decision making (see below for details).      Patient complaining of vague myriad of symptoms ever since her Covid vaccine.  Suspect she is experiencing some mild side effects such as body soreness.  She describes numbness but it does not truly sensory loss, her neurological exam is very reassuring.  There is initial concern for confusion but after further discussions using the video interpreter I believe that she is not acutely encephalopathic, she may have some mild underlying dementia where she does not always know exactly what month or year it is.  Awaiting labs, CT head, doubt emergent process, suspect will discharge.    Barth Kirks. Sedonia Small, Erick [email or web address]  Final Clinical Impressions(s) / ED Diagnoses     ICD-10-CM   1. Body aches  R52     ED Discharge Orders    None       Discharge Instructions Discussed with and Provided to Patient:     Discharge Instructions     You were evaluated in the  Emergency Department and after careful evaluation, we did not find any emergent condition requiring admission or further testing in the hospital.  Your exam/testing today is overall reassuring.  We suspect that your symptoms are related to mild side effects of the code vaccine.  Please return to the Emergency Department if you experience any worsening of your condition.  We encourage you to follow up with a primary care provider.  Thank you for allowing [place] to be a part of your care.       Maudie Flakes, MD 06/18/19 [phone]

## 2019-06-18 NOTE — ED Notes (Signed)
Spoke to Pt via interpreter. Pt stated that she felt fine and wanted to go home. It was explained to her that the Dr needed to review her blood work before making a determination.   It should also be  Noted that a family member is on the to her room.

## 2019-06-18 NOTE — ED Triage Notes (Signed)
Pt arrives to ED from home with complaints of numbness in her arms and legs starting last night. Patient states her body feels dull. Patient states she received her COVID shot on the 4th of May and since has been weak and lethargic. Patient states this morning she became SOB and has centralized CP. Triage obtained using interpreter services.

## 2019-06-18 NOTE — Discharge Instructions (Addendum)
You were evaluated in the Emergency Department and after careful evaluation, we did not find any emergent condition requiring admission or further testing in the hospital.  Your exam/testing today is overall reassuring.  We suspect that your symptoms are related to mild side effects of the code vaccine.  Please return to the Emergency Department if you experience any worsening of your condition.  We encourage you to follow up with a primary care provider.  Thank you for allowing [place] to be a part of your care.

## 2019-06-18 NOTE — ED Notes (Signed)
Having difficulty speaking with Pt. Even the translator seems to be having trouble. Pt knows name and place but stated the year is 2011. However it is possible that she does not understand the question.   Pt states that she has generealized chest pain and generalized numbness since her covid shot 3 days ago.  S1,S2 and lung sounds clear Vitals WDL with exception of BP a little high, 160 systolic.

## 2019-06-18 NOTE — ED Notes (Signed)
Pt's son at bedside. He stated that his mother saw the news about people getting numb after the 2nd covid dose. Son speculates that she may have worked herself up over the information on the news.  Marland Kitchen

## 2020-07-11 IMAGING — CT CT HEAD W/O CM
4 series · 17 of 47 positions shown, 19 images · non-contrast
Comparison: CT brain 12/24/2016

CLINICAL DATA: Numbness

EXAM:
CT HEAD WITHOUT CONTRAST
TECHNIQUE: Contiguous axial images were obtained from the base of the skull
through the vertex without intravenous contrast.

[Series 3: head without · axial · non-contrast · 0.40mm/px · z∈[-146,-16]mm · 7 of 36 slices shown, 9 images]
[im 5/36  brain]
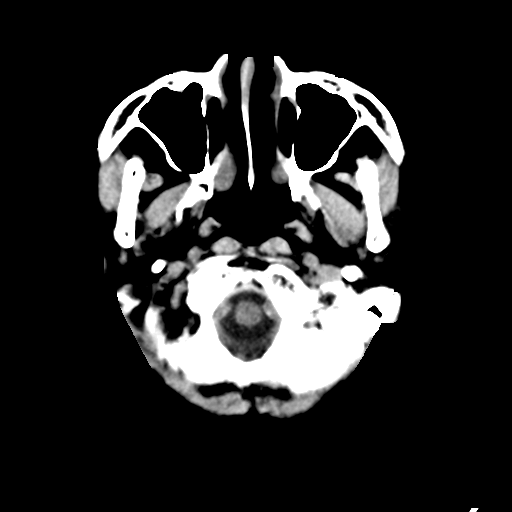
[im 5/36  bone]
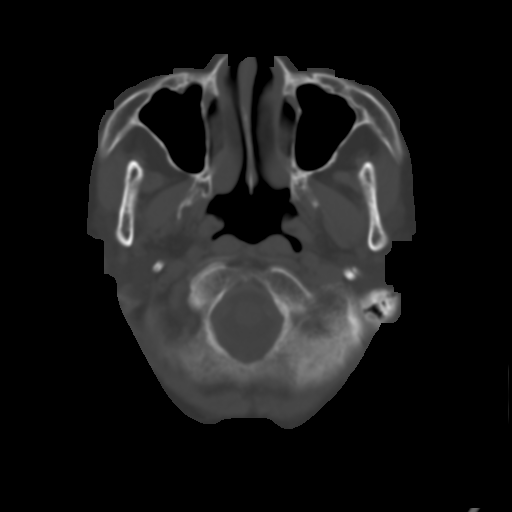
[im 9/36  brain]
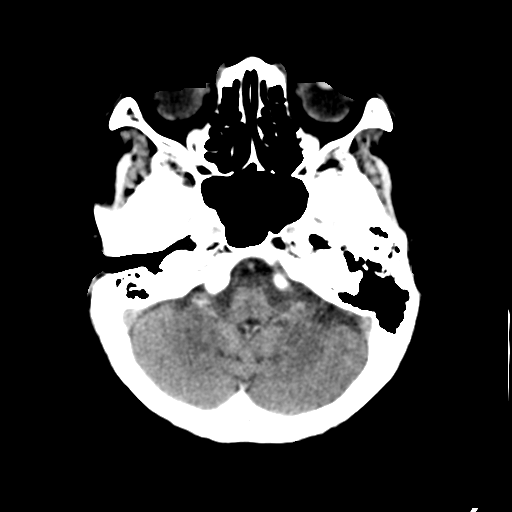
[im 14/36  brain]
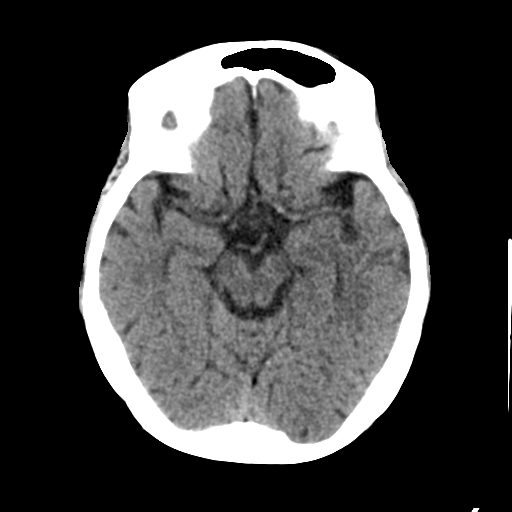
[im 18/36  brain]
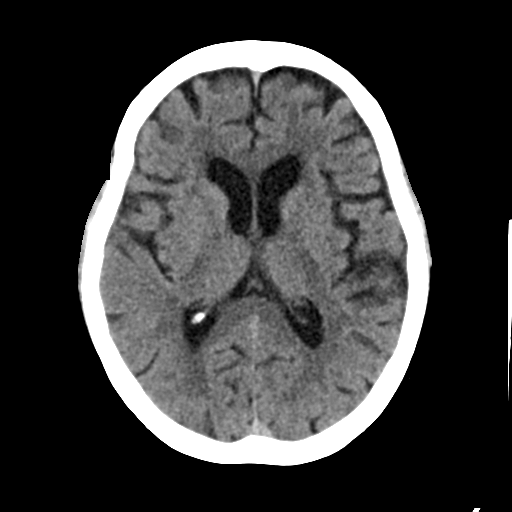
[im 22/36  brain]
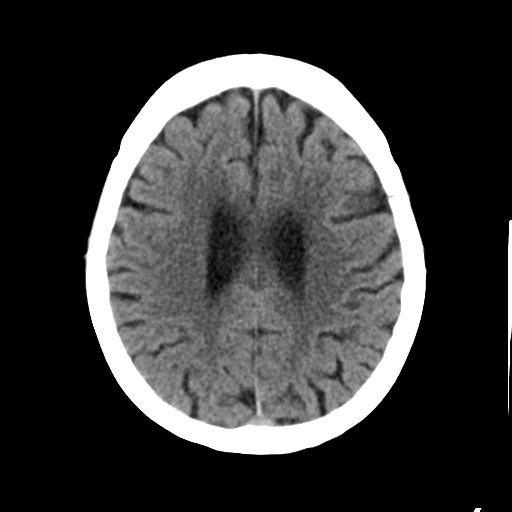
[im 22/36  bone]
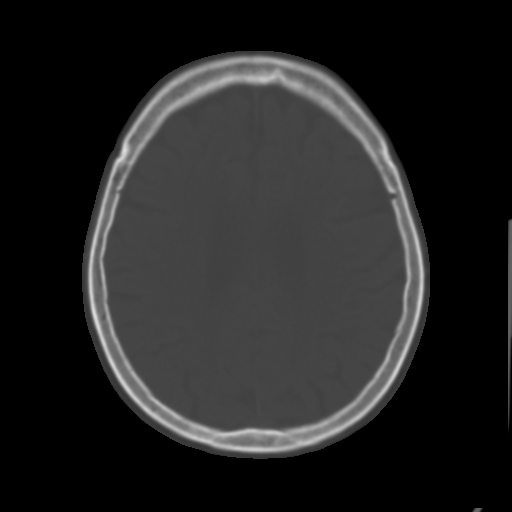
[im 27/36  brain]
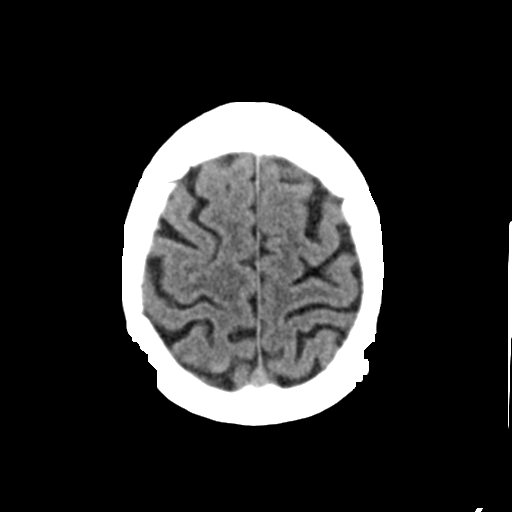
[im 31/36  brain]
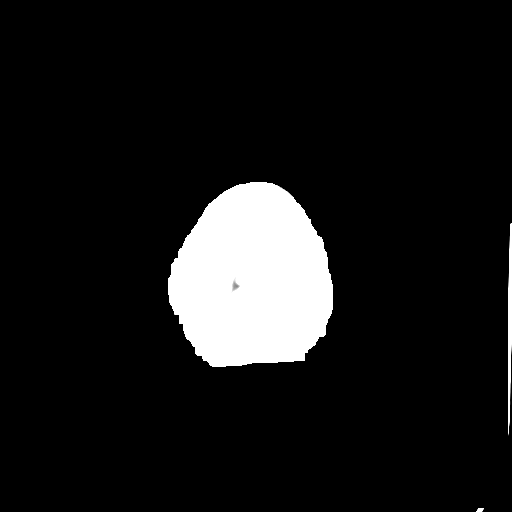

[Series 4: head bone · axial · 0.40mm/px · z∈[-150,-88]mm · 4 of 88 slices shown]
[im 9/88  bone]
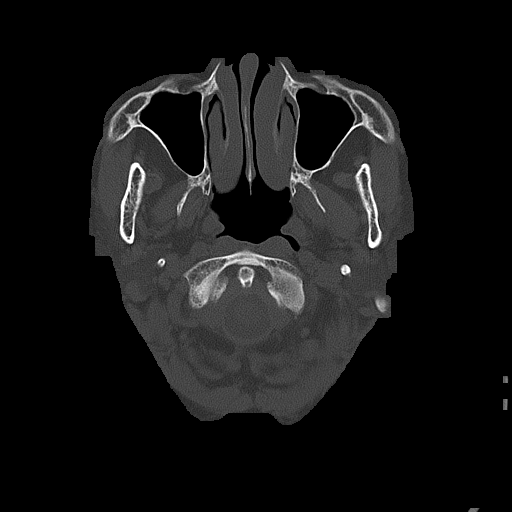
[im 18/88  bone]
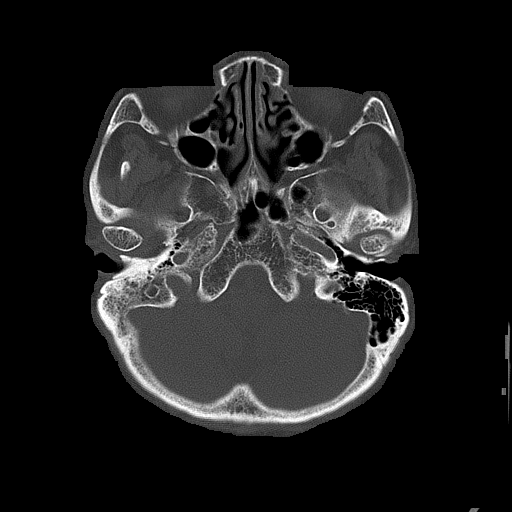
[im 27/88  bone]
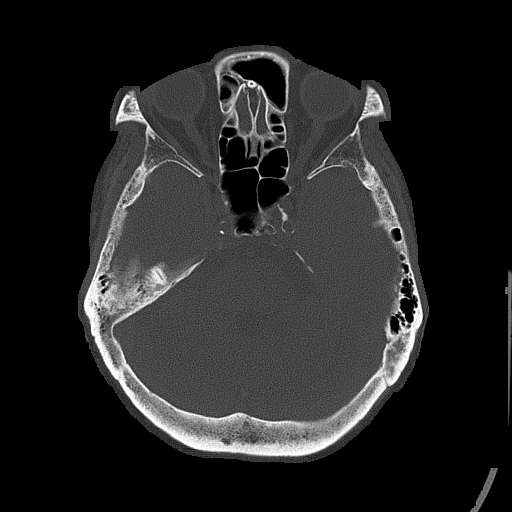
[im 40/88  bone]
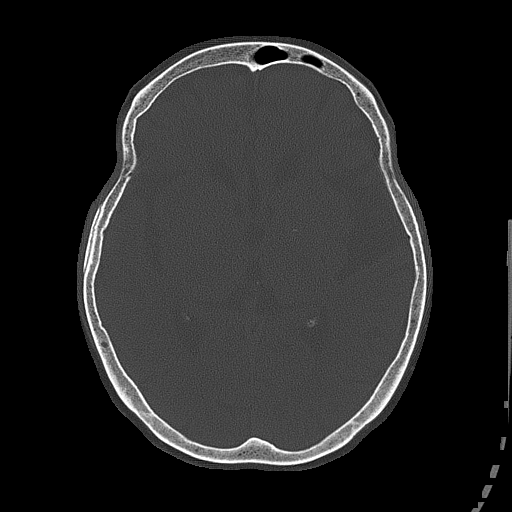

[Series 5: head without cor · coronal · non-contrast · 0.34mm/px · 3 of 68 slices shown]
[im 23/68  brain]
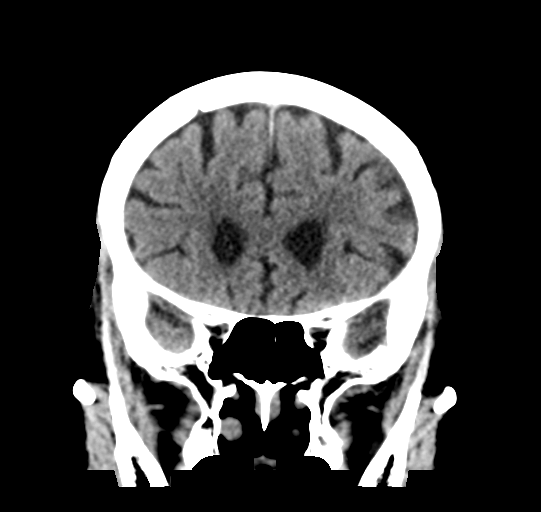
[im 30/68  brain]
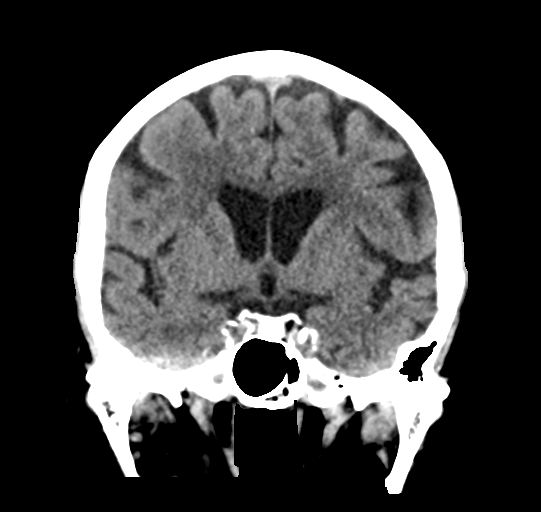
[im 38/68  brain]
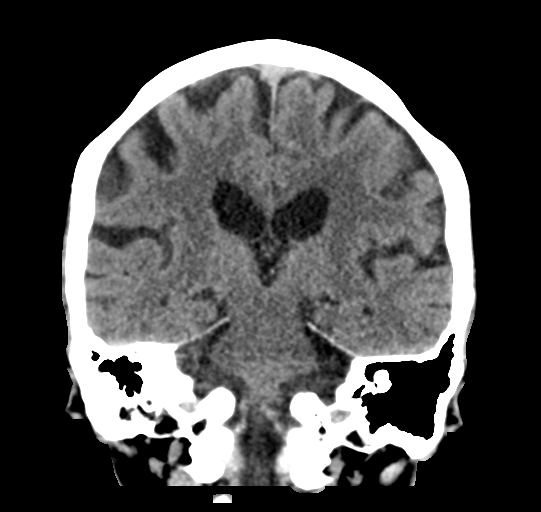

[Series 6: head without sag · sagittal · non-contrast · 0.34mm/px · 3 of 57 slices shown]
[im 19/57  brain]
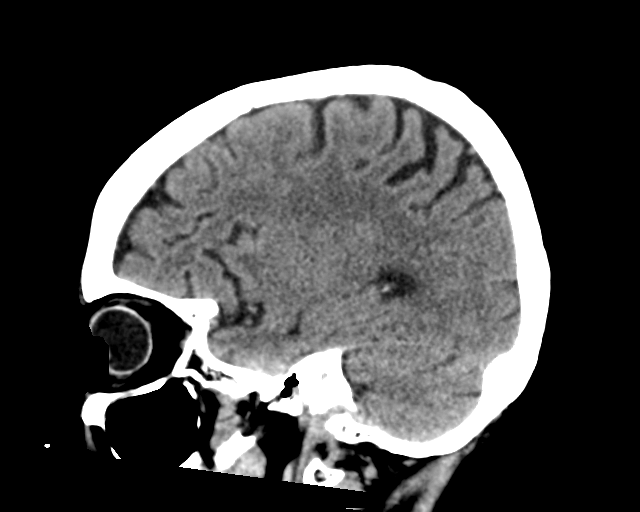
[im 29/57  brain]
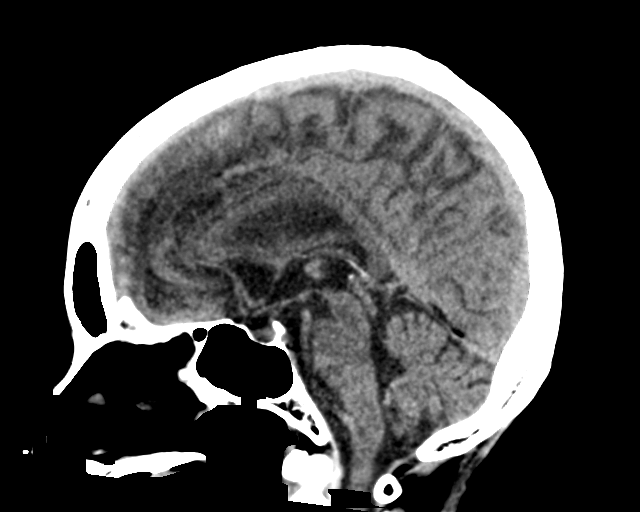
[im 38/57  brain]
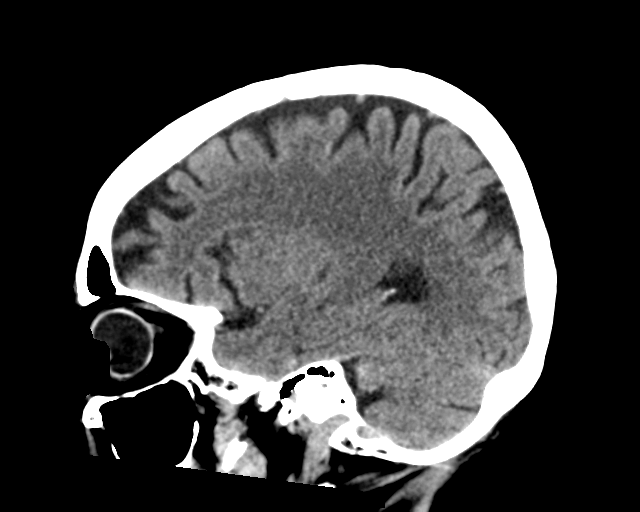

[17 of 47 positions shown; findings below may reference images not displayed]

FINDINGS: Brain: No acute territorial infarction, hemorrhage or intracranial
mass. Atrophy and mild chronic small vessel ischemic change of the
white matter. Nonenlarged ventricles.

Vascular: No hyperdense vessels. Vertebral and carotid vascular
calcification

Skull: Normal. Negative for fracture or focal lesion.

Sinuses/Orbits: Mucosal thickening in the ethmoid sinuses

Other: None
IMPRESSION: 1. No CT evidence for acute intracranial abnormality.
2. Atrophy and chronic small vessel ischemic change of the white
matter
